# Patient Record
Sex: Male | Born: 1970 | Race: White | Hispanic: No | Marital: Married | Smoking: Current every day smoker
Health system: Southern US, Community
[De-identification: ages and names within clinical notes are randomized; demographics above are authoritative.]

## PROBLEM LIST (undated history)

## (undated) DIAGNOSIS — E785 Hyperlipidemia, unspecified: Secondary | ICD-10-CM

## (undated) DIAGNOSIS — I2699 Other pulmonary embolism without acute cor pulmonale: Secondary | ICD-10-CM

## (undated) DIAGNOSIS — M25561 Pain in right knee: Secondary | ICD-10-CM

## (undated) DIAGNOSIS — E119 Type 2 diabetes mellitus without complications: Secondary | ICD-10-CM

## (undated) DIAGNOSIS — I10 Essential (primary) hypertension: Secondary | ICD-10-CM

## (undated) DIAGNOSIS — F909 Attention-deficit hyperactivity disorder, unspecified type: Secondary | ICD-10-CM

## (undated) DIAGNOSIS — I82409 Acute embolism and thrombosis of unspecified deep veins of unspecified lower extremity: Secondary | ICD-10-CM

## (undated) DIAGNOSIS — I2510 Atherosclerotic heart disease of native coronary artery without angina pectoris: Secondary | ICD-10-CM

## (undated) DIAGNOSIS — I252 Old myocardial infarction: Secondary | ICD-10-CM

## (undated) DIAGNOSIS — K429 Umbilical hernia without obstruction or gangrene: Secondary | ICD-10-CM

## (undated) DIAGNOSIS — M1990 Unspecified osteoarthritis, unspecified site: Secondary | ICD-10-CM

## (undated) HISTORY — DX: Pain in right knee: M25.561

## (undated) HISTORY — DX: Essential (primary) hypertension: I10

## (undated) HISTORY — DX: Hyperlipidemia, unspecified: E78.5

## (undated) HISTORY — DX: Old myocardial infarction: I25.2

## (undated) HISTORY — DX: Atherosclerotic heart disease of native coronary artery without angina pectoris: I25.10

## (undated) HISTORY — DX: Acute embolism and thrombosis of unspecified deep veins of unspecified lower extremity: I82.409

## (undated) HISTORY — PX: COLONOSCOPY: WVUENDOPRO10

## (undated) HISTORY — DX: Other pulmonary embolism without acute cor pulmonale: I26.99

## (undated) HISTORY — DX: Type 2 diabetes mellitus without complications: E11.9

## (undated) HISTORY — DX: Attention-deficit hyperactivity disorder, unspecified type: F90.9

## (undated) HISTORY — PX: HX LAP CHOLECYSTECTOMY: SHX56

## (undated) HISTORY — PX: HX CORONARY STENT PLACEMENT: SHX49

## (undated) HISTORY — PX: HX CORONARY ARTERY BYPASS GRAFT: SHX141

## (undated) HISTORY — PX: ANKLE SURGERY: SHX546

## (undated) HISTORY — PX: REPLACEMENT TOTAL KNEE: SUR1224

## (undated) HISTORY — PX: UMBILICAL HERNIA REPAIR: SHX196

## (undated) HISTORY — PX: CHOLECYSTECTOMY: SHX55

## (undated) HISTORY — DX: Unspecified osteoarthritis, unspecified site: M19.90

## (undated) HISTORY — DX: Umbilical hernia without obstruction or gangrene: K42.9

## (undated) HISTORY — PX: VASECTOMY: SHX75

---

## 2004-10-15 HISTORY — PX: KNEE ARTHROSCOPY: SUR90

## 2009-10-27 ENCOUNTER — Encounter (INDEPENDENT_AMBULATORY_CARE_PROVIDER_SITE_OTHER): Payer: Self-pay | Admitting: ORTHOPEDIC, SPORTS MEDICINE

## 2009-10-27 ENCOUNTER — Ambulatory Visit (INDEPENDENT_AMBULATORY_CARE_PROVIDER_SITE_OTHER): Payer: BC Managed Care – PPO

## 2009-10-27 ENCOUNTER — Ambulatory Visit (INDEPENDENT_AMBULATORY_CARE_PROVIDER_SITE_OTHER): Payer: BC Managed Care – PPO | Admitting: ORTHOPEDIC, SPORTS MEDICINE

## 2009-10-27 ENCOUNTER — Encounter (FREE_STANDING_LABORATORY_FACILITY)
Admit: 2009-10-27 | Discharge: 2009-10-27 | Disposition: A | Discharge status: Home / Self Care | Payer: BLUE CROSS/BLUE SHIELD | Attending: Orthopaedics-Adult Reconstruction | Admitting: Orthopaedics-Adult Reconstruction

## 2009-10-27 VITALS — BP 156/101 | HR 98 | Temp 97.8°F | Ht 73.0 in | Wt 290.0 lb

## 2009-10-27 DIAGNOSIS — M25569 Pain in unspecified knee: Secondary | ICD-10-CM | POA: Diagnosis present

## 2009-10-27 LAB — RHEUMATOID FACTOR, SERUM: RHEUMATOID FACTOR: <20 IU/mL (ref <20)

## 2009-10-27 LAB — SEDIMENTATION RATE: SEDIMENTATION RATE: 1 mm/hr (ref 0–15)

## 2009-10-27 MED ORDER — SODIUM HYALURONATE 10 MG/ML INTRA-ARTICULAR SYRINGE
INJECTION | INTRA_ARTICULAR | Status: DC
Start: 2009-10-27 — End: 2023-03-12

## 2009-10-27 MED ORDER — DICLOFENAC SODIUM 75 MG TABLET,DELAYED RELEASE
75.0000 mg | DELAYED_RELEASE_TABLET | Freq: Every day | ORAL | Status: DC
Start: 2009-10-27 — End: 2023-03-12

## 2009-10-27 NOTE — Progress Notes (Signed)
Name: [facility]  MEDICAL RECORD [id number]  DATE OF BIRTH: 07-20-1971  DATE OF SERVICE: 10/27/2009       SUBJECTIVE:  Shawn Zhang is a pleasant 39 y.o. year old who presents to clinic today for evaluation of his knee.  Pain has been present for many years. There has been antecedent trauma. Date of Injury (DOI) = football injury in high school. Has had arthroscopic surgery on his right knee twice. The pain is localized to the anterior/medial aspect of the joint. Rated as 7 out of 10. Worsened with prolonged walking or sitting. Associated symptoms include: swelling. Prior treatments include: ice, naproxen, pt, steroid injections, viscous injections, and brace. These were helpful.       PAST MEDICAL HISTORY  No past medical history on file.     PAST SURGICAL HISTORY  No past surgical history on file.    MEDICATIONS  Current outpatient prescriptions   Medication Sig    Diclofenac Sodium (VOLTAREN) 75 mg TbEC take 1 Tab by mouth Once a day.     Sodium Hyaluronate (Viscosup) (EUFLEXXA) 10 mg/mL Syrg by Intra-articular route. To be injected once a week x 3 weeks by physician          ALLERGIES  No Known Allergies.    SOCIAL HISTORY  History   Social History    Marital Status: Single     Spouse Name: N/A     Number of Children: N/A    Years of Education: N/A   Occupational History    Not on file.   Social History Main Topics    Tobacco Use: Yes -- 0.5 packs/day for 20 years    Alcohol Use: Not on file    Drug Use: Not on file    Sexually Active: Not on file   Other Topics Concern    Not on file   Social History Narrative    No narrative on file         FAMILY HISTORY  DM, CAD    REVIEW OF SYSTEMS  As above, otherwise all other systems are essentially negative.    PHYSICAL EXAM    BP 156/101   Pulse 98   Temp 36.6 C (97.8 F)   Ht 1.854 m (6\' 1" )   Wt 131.543 kg (290 lb)       In no acute distress with appropriate mood and affect.  He is alert and oriented times three.  HEENT: NC/AT Pupils are  equal and round.  PULM: respirations are unlabored.  CV: Pulses palpable and equal distally.  GI: Abdomen is nondistended.  DERM: Skin is warm and dry to touch.    On physical examination of the patient's bilateral knee, .    Range of Motion:   Strength:   Tenderness to Palpation:   Swelling:   Special Test:   Neurologic:   Gait:       RADIOGRAPHY:  Plain film x-rays of the patient's right and bilateral knee were reviewed in clinic today and show R>L advanced arthrosis with osteophyte formation, and joint space narrowing throughout.     ASSESSMENT: 39 y.o. year old male with Encounter Diagnoses   Code Name Primary? Qualifier    719.46J Knee pain Yes     Plan: XR KNEE SPORTS SERIES, BILAT, DICLOFENAC 75 MG TAB, DELAYED RELEASE, C-REACTIVE PROTEIN (INFLAMMATION), SEDIMENTATION RATE, ANA, CYCLIC CITRULLINATED PEP IGG, RHEUMATOID FACTOR    715.96Q OA (osteoarthritis) of knee      Plan: XR KNEE SPORTS  SERIES, BILAT, DICLOFENAC 75 MG TAB, DELAYED RELEASE      .      PLAN:  At today's visit, we discussed treatment options concerning patient's bilateral knee arthrosis.  Will check labwork for possible rheumatologic component given his age, but clinically this is most consistent with degenrative osteoarthrosis.  Will try voltaren as he has only used ibuprofen and naproxen in the past.  Also will consider Euflexxa injections and have requested authorization for these today.  He  will f/u in clinic for the euflexxa injections.  If the patient has any questions or concerns prior to next visit, he can contact our office.        Levander Campion, MD 10/27/2009, 2:35 PM

## 2009-10-29 LAB — CYCLIC CITRULLINATED PEPTIDE ANTIBODIES, IGG, SERUM: CYCLIC CITRULLINATED PEP IgG: 4

## 2009-10-31 LAB — HEP-2 SUBSTRATE ANTINUCLEAR ANTIBODIES (ANA), SERUM: ANTI-NUCLEAR AB: NEGATIVE

## 2010-07-11 ENCOUNTER — Ambulatory Visit (HOSPITAL_COMMUNITY): Payer: Self-pay

## 2011-06-04 ENCOUNTER — Encounter (INDEPENDENT_AMBULATORY_CARE_PROVIDER_SITE_OTHER): Payer: Self-pay | Admitting: Surgery

## 2011-06-07 ENCOUNTER — Ambulatory Visit (INDEPENDENT_AMBULATORY_CARE_PROVIDER_SITE_OTHER): Payer: BC Managed Care – PPO | Admitting: Surgery

## 2011-06-15 ENCOUNTER — Ambulatory Visit (INDEPENDENT_AMBULATORY_CARE_PROVIDER_SITE_OTHER): Payer: BC Managed Care – PPO | Admitting: Surgery

## 2011-06-15 ENCOUNTER — Encounter (INDEPENDENT_AMBULATORY_CARE_PROVIDER_SITE_OTHER): Payer: Self-pay | Admitting: Surgery

## 2011-06-15 VITALS — BP 144/98 | HR 80 | Temp 97.3°F | Ht 73.0 in | Wt 304.6 lb

## 2011-06-15 DIAGNOSIS — K429 Umbilical hernia without obstruction or gangrene: Secondary | ICD-10-CM

## 2011-06-15 NOTE — Patient Instructions (Signed)
Try to continue loosing weight.  Try to quit smoking.  No limit on exercise or lifting.

## 2011-06-15 NOTE — Progress Notes (Signed)
ASSESSMENT AND PLAN: 1.  Umbilical hernia.  I gave the patient a book on hernia repairs.  I discussed the indications and complications of hernia surgery with the patient.  I discussed both the laparoscopic and open approach to hernia repair..  The potential risks of hernia surgery include, but are not limited to, bleeding, infection, open surgery, nerve injury, and recurrence of the hernia.  I provided the patient literature about hernia surgery.  I discussed two goals:  1.) quitting smoking, 2.) continuing to loose weight.  Both of these goals will improve the chance of the hernia surgery doing better and better long term results.  I did not limit his activity.  The patient will see me back in 4 months.  He will see me earlier if the hernia enlarges rapidly or causes more symptoms.  2.  Diabetes Mellitus  Diet only.  Blood sugars are better since he has lost weight.  3.  Morbid Obesity - BMI 40  Trying to loose weight to control diabetes.  4.  Smokes 1 PPD. 5.  Degenerative arthritis of right knee.  Chief Complaint  Patient presents with  . Other    new pt- eval umb hernia    Justin Sweeney is a 40 y.o. (DOB: Mar 12, 1971)  white male who is a patient of Mauricio Po, FNP, [facility], and comes to me today for umiblical hernia.  The patient recently moved here to our area from [place]. 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  He has noticed an umbilical hernia for about one year. He developed gallbladder disease and had a cholecystectomy in [place] about 6 months ago. He had the umbilical hernia before the cholecystectomy, but they did not fix the umbilical hernia at the time of surgery because of the risk of infection.  He actually had heard all the pros and cons about the hernia repair in [place].    He has pain from the umbilical hernia. He denies a history of peptic ulcer disease, liver disease, pancreatic disease, or colon  disease.  He is accompanied by his wife.  Past Medical History  Diagnosis Date  . Diabetes mellitus   . Umbilical hernia   . Arthritis   . Umbilical hernia     Past Surgical History  Procedure Date  . Cholecystectomy     2011  . Vasectomy     2011  . Knee arthroscopy 2006    right x2    Current Outpatient Prescriptions  Medication Sig Dispense Refill  . atomoxetine (STRATTERA) 40 MG capsule Take 40 mg by mouth daily.        . Lancets (ONETOUCH ULTRASOFT) lancets daily.      . ONE TOUCH ULTRA TEST test strip daily.       No Known Allergies  Review of Systems: Skin:  No history of rash.  No history of abnormal moles. Infection:    No history of MRSA. Neurologic:  No history of stroke.  No history of seizure.  No history of headaches. Cardiac:  No history of hypertension. No history of heart disease.  No history of prior cardiac catheterization.  No history of seeing a cardiologist. Pulmonary:  Does  smoke cigarettes. 1 PPD.  No asthma or bronchitis.  No OSA/CPAP.  Endocrine:  Diabetes, but diet controlled.  And with his weight loss, his blood sugars are better. No thyroid disease. Gastrointestinal:  No history of stomach disease.  No history of liver disease.  No history of gall bladder disease.  No history of pancreas disease.  No history of colon disease. Urologic:  No history of kidney stones.  No history of bladder infections. Musculoskeletal:  Right knee arthroscopy in past.  The knee still bothers him and limits his activity.  He has seen [facility] secondary to a fall. Hematologic:  No bleeding disorder.  No history of anemia.  Not anticoagulated.  SOCIAL and FAMILY HISTORY: Accopanied with wife. Works at [employer] a [employer] ([employer] - [place]). Two children - 14 and 11  PHYSICAL EXAM: BP 144/98  Pulse 80  Temp(Src) 97.3 F (36.3 C) (Temporal)  Ht 6\' 1"  (1.854 m)  Wt 304 lb 9.6 oz (138.166 kg)  BMI 40.19 kg/m2  HEENT: Normal.  Pupils equal. Normal dentition. Neck: Supple. No thyroid mass. Lymph Nodes:  No supraclavicular or cervical nodes. Lungs: Clear and symmetric. Heart:  RRR. No murmur.  Abdomen: Obese. No mass. No tenderness. Umbilical hernia.  Reducible.  Normal bowel sounds.  No abdominal scars. Rectal: Not done. Extremities:  Good strength in upper and lower extremities. Neurologic:  Grossly intact to motor and sensory function.  DATA REVIEWED: Chart from [facility]

## 2011-11-09 ENCOUNTER — Encounter (INDEPENDENT_AMBULATORY_CARE_PROVIDER_SITE_OTHER): Payer: Self-pay | Admitting: Surgery

## 2011-11-09 ENCOUNTER — Ambulatory Visit (INDEPENDENT_AMBULATORY_CARE_PROVIDER_SITE_OTHER): Payer: BC Managed Care – PPO | Admitting: Surgery

## 2011-11-09 VITALS — BP 158/90 | HR 72 | Temp 97.4°F | Resp 18 | Ht 73.0 in | Wt 286.0 lb

## 2011-11-09 DIAGNOSIS — K429 Umbilical hernia without obstruction or gangrene: Secondary | ICD-10-CM | POA: Insufficient documentation

## 2011-11-09 NOTE — Progress Notes (Signed)
ASSESSMENT AND PLAN: 1.  Umbilical hernia.  I had reviewed with the patient and I gave him a book on hernia repairs in June 15, 2011.  I discussed the indications and complications of hernia surgery with the patient.  I discussed both the laparoscopic and open approach to hernia repair..  The potential risks of hernia surgery include, but are not limited to, bleeding, infection, open surgery, nerve injury, and recurrence of the hernia.  I provided the patient literature about hernia surgery at his last visit and he said he did not need more.  He had two goals:  1.) quitting smoking (he has cut back, to below 1 ppd), 2.) continuing to loose weight (he has lost an additional 20 pounds).   He's ready to go ahead with surgery.  We will schedule this. 2.  Diabetes Mellitus  Diet only.  Blood sugars are better since he has lost weight. 3.  Morbid Obesity - BMI 37.7  He has lost almost 40 pounds since he has been trying.  I tried to encourage him to continue. 4.  Smokes 1 PPD.  He says his is under one PPD.  He tried Chantix, but this gave him severe migraine HAs. 5.  Degenerative arthritis of right knee. 6.  ADD - he was on Strattera, but has been switched to Adderal with which he is doing better.  Chief Complaint  Patient presents with  . Umbilical Hernia   Justin Sweeney is a 41 y.o. (DOB: [birth year])  white male who is a patient of Mauricio Po, FNP, [facility], and comes to me today for follow up of an umiblical hernia. I originally saw him in August 2012.  He is having more epigastric pain since I last saw him, but I am not sure this is coming from the umbilical hernia.  He has lost weight and his is cutting back on his smoking.   He denies a history of peptic ulcer disease, liver disease, pancreatic disease, or colon disease. He is ready to go ahead with the hernia repair.  Past Medical History  Diagnosis Date  . Diabetes mellitus   . Umbilical hernia   . Arthritis   .  Umbilical hernia     Past Surgical History  Procedure Date  . Cholecystectomy     2011  . Vasectomy     2011  . Knee arthroscopy 2006    right x2    Current Outpatient Prescriptions  Medication Sig Dispense Refill  . amphetamine-dextroamphetamine (ADDERALL XR) 20 MG 24 hr capsule Take 20 mg by mouth 2 (two) times daily.      . Lancets (ONETOUCH ULTRASOFT) lancets daily.      . ONE TOUCH ULTRA TEST test strip daily.       No Known Allergies  Review of Systems: Skin:  No history of rash.  No history of abnormal moles. Infection:    No history of MRSA. Neurologic:  No history of stroke.  No history of seizure.  No history of headaches. Cardiac:  No history of hypertension. No history of heart disease.  No history of prior cardiac catheterization.  No history of seeing a cardiologist. Pulmonary:  Does  smoke cigarettes. 1 PPD.  No asthma or bronchitis.  No OSA/CPAP.  Endocrine:  Diabetes, but diet controlled.  And with his weight loss, his blood sugars are better. No thyroid disease. Gastrointestinal:  No history of stomach disease.  No history of liver disease.  No history of gall bladder  disease.  No history of pancreas disease.  No history of colon disease. Urologic:  No history of kidney stones.  No history of bladder infections. Musculoskeletal:  Right knee arthroscopy in past.  The knee still bothers him and limits his activity.  He has seen [facility] secondary to a fall. Hematologic:  No bleeding disorder.  No history of anemia.  Not anticoagulated. Psychologic:  History of ADD.  On Adderal now and doing better.  SOCIAL and FAMILY HISTORY: Accopanied with wife. Works at [employer] a [employer] ([employer] - [place]). Two children - 14 and 11  PHYSICAL EXAM: BP 158/90  Pulse 72  Temp(Src) 97.4 F (36.3 C) (Temporal)  Resp 18  Ht 6\' 1"  (1.854 m)  Wt 286 lb (129.729 kg)  BMI 37.73 kg/m2  HEENT: Normal. Pupils equal. Normal dentition. Neck: Supple.  No thyroid mass. Lymph Nodes:  No supraclavicular or cervical nodes. Lungs: Clear and symmetric. Heart:  RRR. No murmur.  Abdomen: Obese. No mass. No tenderness. Umbilical hernia.  Reducible.  Normal bowel sounds.  No abdominal scars.  He points to pain between his umbilicus and xiphoid, but I feel no mass in that area. Rectal: Not done. Extremities:  Good strength in upper and lower extremities. Neurologic:  Grossly intact to motor and sensory function.  DATA REVIEWED: None new.

## 2011-11-13 DIAGNOSIS — K429 Umbilical hernia without obstruction or gangrene: Secondary | ICD-10-CM

## 2011-11-13 HISTORY — PX: HERNIA REPAIR: SHX51

## 2011-11-14 ENCOUNTER — Telehealth (INDEPENDENT_AMBULATORY_CARE_PROVIDER_SITE_OTHER): Payer: Self-pay | Admitting: Surgery

## 2011-11-14 ENCOUNTER — Encounter (INDEPENDENT_AMBULATORY_CARE_PROVIDER_SITE_OTHER): Payer: Self-pay

## 2011-11-14 NOTE — Telephone Encounter (Signed)
I called the patient and gave his appointment.  I typed a letter for his wife to pick up tomorrow.

## 2011-12-07 ENCOUNTER — Ambulatory Visit (INDEPENDENT_AMBULATORY_CARE_PROVIDER_SITE_OTHER): Payer: BC Managed Care – PPO | Admitting: Surgery

## 2011-12-07 ENCOUNTER — Encounter (INDEPENDENT_AMBULATORY_CARE_PROVIDER_SITE_OTHER): Payer: Self-pay | Admitting: Surgery

## 2011-12-07 VITALS — BP 114/86 | HR 72 | Temp 98.4°F | Resp 16 | Ht 73.0 in | Wt 285.4 lb

## 2011-12-07 DIAGNOSIS — K429 Umbilical hernia without obstruction or gangrene: Secondary | ICD-10-CM

## 2011-12-07 NOTE — Progress Notes (Signed)
ASSESSMENT AND PLAN: 1.  Umbilical hernia.  Umbilical hernia repair 11/13/2011 at [facility].  Repaired with 6.4 cm circular Proceed.  He had two goals:  1.) quitting smoking (he has cut back, to below 1 ppd), 2.) continuing to loose weight (he lost an additional 20 pounds before surgery).    2.  Diabetes Mellitus  Diet only.  Blood sugars are better since he has lost weight. 3.  Morbid Obesity - BMI 37.7  He has lost almost 40 pounds since he has been trying.  I tried to encourage him to continue. 4.  Smokes 1 PPD.  He says his is under one PPD.  He tried Chantix, but this gave him severe migraine HAs. 5.  Degenerative arthritis of right knee. 6.  ADD - he was on Strattera, but has been switched to Adderal with which he is doing better.  Chief Complaint  Patient presents with  . Routine Post Op    PO umb hernia 11/13/11   Justin Sweeney is a 41 y.o. (DOB: July 09, 1971)  white male who is a patient of Mauricio Po, FNP, [facility], and comes to me today for follow up of an umiblical hernia repair on 11/13/2011. He has done well.  He has no pain.  And he is back at work.  I told him to limit his lifting for a full month after the surgery.  Past Medical History  Diagnosis Date  . Diabetes mellitus   . Umbilical hernia   . Arthritis   . Umbilical hernia    Current Outpatient Prescriptions  Medication Sig Dispense Refill  . amphetamine-dextroamphetamine (ADDERALL XR) 20 MG 24 hr capsule Take 20 mg by mouth 2 (two) times daily.      . Lancets (ONETOUCH ULTRASOFT) lancets daily.      Marland Kitchen lisinopril (PRINIVIL,ZESTRIL) 10 MG tablet daily.      . ONE TOUCH ULTRA TEST test strip daily.       No Known Allergies  Review of Systems: Pulmonary:  Does  smoke cigarettes. 1 PPD.  No asthma or bronchitis.  No OSA/CPAP. Endocrine:  Diabetes, but diet controlled.  And with his weight loss, his blood sugars are better. No thyroid disease.  Musculoskeletal:  Right knee  arthroscopy in past.  The knee still bothers him and limits his activity.  He has seen [facility] secondary to a fall. Psychologic:  History of ADD.  On Adderal now and doing better.  SOCIAL and FAMILY HISTORY: Married. Works at [employer] a [employer] ([employer] - [place]). Two children - 14 and 11  PHYSICAL EXAM: BP 114/86  Pulse 72  Temp(Src) 98.4 F (36.9 C) (Temporal)  Resp 16  Ht 6\' 1"  (1.854 m)  Wt 285 lb 6.4 oz (129.457 kg)  BMI 37.65 kg/m2  Abdomen: Scar from umbilical hernia repair looks good.  DATA REVIEWED: None new.

## 2011-12-20 ENCOUNTER — Encounter (INDEPENDENT_AMBULATORY_CARE_PROVIDER_SITE_OTHER): Payer: Self-pay | Admitting: Surgery

## 2013-12-15 ENCOUNTER — Encounter (HOSPITAL_BASED_OUTPATIENT_CLINIC_OR_DEPARTMENT_OTHER): Payer: Self-pay | Admitting: Emergency Medicine

## 2013-12-15 ENCOUNTER — Emergency Department (HOSPITAL_BASED_OUTPATIENT_CLINIC_OR_DEPARTMENT_OTHER): Payer: Worker's Compensation

## 2013-12-15 ENCOUNTER — Emergency Department (HOSPITAL_BASED_OUTPATIENT_CLINIC_OR_DEPARTMENT_OTHER)
Admission: EM | Admit: 2013-12-15 | Discharge: 2013-12-15 | Disposition: A | Discharge status: Home / Self Care | Payer: Worker's Compensation | Attending: Emergency Medicine | Admitting: Emergency Medicine

## 2013-12-15 DIAGNOSIS — M25522 Pain in left elbow: Secondary | ICD-10-CM

## 2013-12-15 DIAGNOSIS — S59919A Unspecified injury of unspecified forearm, initial encounter: Secondary | ICD-10-CM

## 2013-12-15 DIAGNOSIS — E119 Type 2 diabetes mellitus without complications: Secondary | ICD-10-CM | POA: Insufficient documentation

## 2013-12-15 DIAGNOSIS — M25512 Pain in left shoulder: Secondary | ICD-10-CM

## 2013-12-15 DIAGNOSIS — Y9289 Other specified places as the place of occurrence of the external cause: Secondary | ICD-10-CM | POA: Insufficient documentation

## 2013-12-15 DIAGNOSIS — S59909A Unspecified injury of unspecified elbow, initial encounter: Secondary | ICD-10-CM | POA: Insufficient documentation

## 2013-12-15 DIAGNOSIS — Y990 Civilian activity done for income or pay: Secondary | ICD-10-CM | POA: Insufficient documentation

## 2013-12-15 DIAGNOSIS — Y9389 Activity, other specified: Secondary | ICD-10-CM | POA: Insufficient documentation

## 2013-12-15 DIAGNOSIS — S46909A Unspecified injury of unspecified muscle, fascia and tendon at shoulder and upper arm level, unspecified arm, initial encounter: Secondary | ICD-10-CM | POA: Insufficient documentation

## 2013-12-15 DIAGNOSIS — F17200 Nicotine dependence, unspecified, uncomplicated: Secondary | ICD-10-CM | POA: Insufficient documentation

## 2013-12-15 DIAGNOSIS — Z8739 Personal history of other diseases of the musculoskeletal system and connective tissue: Secondary | ICD-10-CM | POA: Insufficient documentation

## 2013-12-15 DIAGNOSIS — S4980XA Other specified injuries of shoulder and upper arm, unspecified arm, initial encounter: Secondary | ICD-10-CM | POA: Insufficient documentation

## 2013-12-15 DIAGNOSIS — S6990XA Unspecified injury of unspecified wrist, hand and finger(s), initial encounter: Secondary | ICD-10-CM | POA: Insufficient documentation

## 2013-12-15 DIAGNOSIS — Z8719 Personal history of other diseases of the digestive system: Secondary | ICD-10-CM | POA: Insufficient documentation

## 2013-12-15 DIAGNOSIS — X500XXA Overexertion from strenuous movement or load, initial encounter: Secondary | ICD-10-CM | POA: Insufficient documentation

## 2013-12-15 MED ORDER — HYDROCODONE-ACETAMINOPHEN 5-325 MG PO TABS: 2.0000 | ORAL_TABLET | ORAL | Status: AC | PRN

## 2013-12-15 MED ORDER — HYDROCODONE-ACETAMINOPHEN 5-325 MG PO TABS
2.0000 | ORAL_TABLET | Freq: Once | ORAL | Status: AC
  Administered 2013-12-15: 2 via ORAL
  Filled 2013-12-15: qty 2

## 2013-12-15 NOTE — ED Provider Notes (Signed)
CSN: [id number]     Arrival date & time 12/15/13  1825 History   First MD Initiated Contact with Patient 12/15/13 1857     Chief Complaint  Patient presents with  . Arm Injury     (Consider location/radiation/quality/duration/timing/severity/associated sxs/prior Treatment) Patient is a 43 y.o. male presenting with arm injury.  Arm Injury Location:  Shoulder and elbow Time since incident:  1 day Injury: no   Shoulder location:  L shoulder Elbow location:  L elbow Pain details:    Quality:  Aching   Radiates to:  Does not radiate   Severity:  Moderate   Onset quality:  Gradual   Timing:  Constant   Progression:  Worsening Chronicity:  New Dislocation: no   Worsened by:  Nothing tried Associated symptoms: no back pain    Pt was lifting today and had a pop in his elbow and his shoulder  Past Medical History  Diagnosis Date  . Diabetes mellitus   . Umbilical hernia   . Arthritis   . Umbilical hernia    Past Surgical History  Procedure Laterality Date  . Cholecystectomy      2011  . Vasectomy      2011  . Knee arthroscopy  2006    right x2  . Hernia repair  11/13/11    umb hernia   Family History  Problem Relation Age of Onset  . Diabetes Mother    History  Substance Use Topics  . Smoking status: Current Every Day Smoker -- 1.00 packs/day    Types: Cigarettes  . Smokeless tobacco: Not on file  . Alcohol Use: No    Review of Systems  Musculoskeletal: Negative for back pain.  All other systems reviewed and are negative.      Allergies  Review of patient's allergies indicates no known allergies.  Home Medications   Current Outpatient Rx  Name  Route  Sig  Dispense  Refill  . amphetamine-dextroamphetamine (ADDERALL XR) 20 MG 24 hr capsule   Oral   Take 20 mg by mouth 2 (two) times daily.         . Lancets (ONETOUCH ULTRASOFT) lancets      daily.         Marland Kitchen. lisinopril (PRINIVIL,ZESTRIL) 10 MG tablet      daily.         . ONE TOUCH ULTRA  TEST test strip      daily.          BP 137/90  Temp(Src) 98.7 F (37.1 C) (Oral)  Resp 20  Ht 6\' 1"  (1.854 m)  Wt 275 lb (124.739 kg)  BMI 36.29 kg/m2  SpO2 100% Physical Exam  Nursing note and vitals reviewed. Constitutional: He is oriented to person, place, and time. He appears well-developed and well-nourished.  Musculoskeletal: He exhibits tenderness.  Tender left elbow and left shoulder, no swelling,  Decreased range of motion,  nv and ns intact  Neurological: He is alert and oriented to person, place, and time. He has normal reflexes.  Skin: Skin is warm.  Psychiatric: He has a normal mood and affect.    ED Course  Procedures (including critical care time) Labs Review Labs Reviewed - No data to display Imaging Review Dg Elbow Complete Left  12/15/2013   CLINICAL DATA:  Pulling injury.  Pain.  EXAM: LEFT ELBOW - COMPLETE 3+ VIEW  COMPARISON:  None.  FINDINGS: No fracture or dislocation.  IMPRESSION: No fracture or dislocation.   Electronically Signed  By: Bridgett Larsson M.D.   On: 12/15/2013 19:41   Dg Shoulder Left  12/15/2013   CLINICAL DATA:  Pulling injury and felt pop.  Pain.  EXAM: LEFT SHOULDER - 2+ VIEW  COMPARISON:  None.  FINDINGS: No fracture or dislocation.  Mild pulmonary vascular prominence.  IMPRESSION: No fracture or dislocation.   Electronically Signed   By: Bridgett Larsson M.D.   On: 12/15/2013 19:40     EKG Interpretation None      MDM   Final diagnoses:  Left shoulder pain  Left elbow pain    xrays normal.   Pt given sling for comfort,  Ice,  Hydrocodone.   See Dr. Pearletha Forge for recheck in 3-4 days    Elson Areas, PA-C 12/15/13 2014

## 2013-12-15 NOTE — Discharge Instructions (Signed)
   Shoulder Pain The shoulder is the joint that connects your arms to your body. The bones that form the shoulder joint include the upper arm bone (humerus), the shoulder blade (scapula), and the collarbone (clavicle). The top of the humerus is shaped like a ball and fits into a rather flat socket on the scapula (glenoid cavity). A combination of muscles and strong, fibrous tissues that connect muscles to bones (tendons) support your shoulder joint and hold the ball in the socket. Small, fluid-filled sacs (bursae) are located in different areas of the joint. They act as cushions between the bones and the overlying soft tissues and help reduce friction between the gliding tendons and the bone as you move your arm. Your shoulder joint allows a wide range of motion in your arm. This range of motion allows you to do things like scratch your back or throw a ball. However, this range of motion also makes your shoulder more prone to pain from overuse and injury. Causes of shoulder pain can originate from both injury and overuse and usually can be grouped in the following four categories:  Redness, swelling, and pain (inflammation) of the tendon (tendinitis) or the bursae (bursitis).  Instability, such as a dislocation of the joint.  Inflammation of the joint (arthritis).  Broken bone (fracture). HOME CARE INSTRUCTIONS   Apply ice to the sore area.  Put ice in a plastic bag.  Place a towel between your skin and the bag.  Leave the ice on for 15-20 minutes, 03-04 times per day for the first 2 days.  Stop using cold packs if they do not help with the pain.  If you have a shoulder sling or immobilizer, wear it as long as your caregiver instructs. Only remove it to shower or bathe. Move your arm as little as possible, but keep your hand moving to prevent swelling.  Squeeze a soft ball or foam pad as much as possible to help prevent swelling.  Only take over-the-counter or prescription medicines for  pain, discomfort, or fever as directed by your caregiver. SEEK MEDICAL CARE IF:   Your shoulder pain increases, or new pain develops in your arm, hand, or fingers.  Your hand or fingers become cold and numb.  Your pain is not relieved with medicines. SEEK IMMEDIATE MEDICAL CARE IF:   Your arm, hand, or fingers are numb or tingling.  Your arm, hand, or fingers are significantly swollen or turn white or blue. MAKE SURE YOU:   Understand these instructions.  Will watch your condition.  Will get help right away if you are not doing well or get worse. Document Released: 07/11/2005 Document Revised: 06/25/2012 Document Reviewed: 09/15/2011 [employer] Patient Information 2014 [employer].  

## 2013-12-15 NOTE — ED Provider Notes (Signed)
Medical screening examination/treatment/procedure(s) were performed by non-physician practitioner and as supervising physician I was immediately available for consultation/collaboration.   EKG Interpretation None        Rolan BuccoMelanie Ariah Mower, MD 12/15/13 2334

## 2013-12-15 NOTE — ED Notes (Signed)
Pt was pulling something heavy at work from a truck and felt a pop in his shoulder and elbow.  Pt complains of unable to lift left arm.

## 2013-12-18 ENCOUNTER — Ambulatory Visit: Payer: Worker's Compensation | Admitting: Family Medicine

## 2016-03-02 IMAGING — CR DG ELBOW COMPLETE 3+V*L*
4 series · 4 of 4 positions shown · non-contrast
Comparison: None.

CLINICAL DATA: Pulling injury.  Pain.

EXAM:
LEFT ELBOW - COMPLETE 3+ VIEW

[x elbow joint ap left]
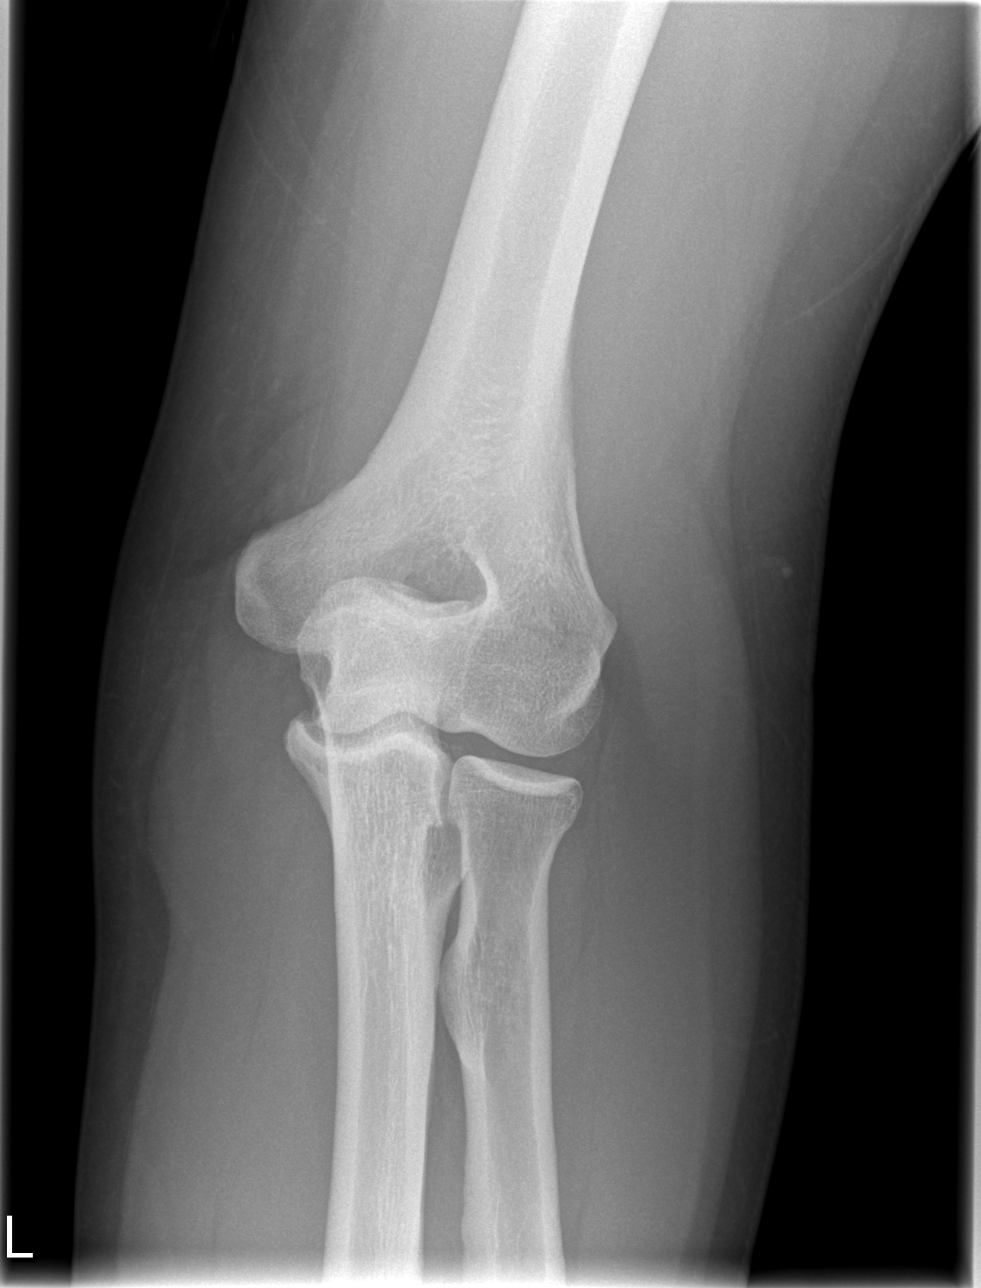

[x elbow joint obl. left (1 of 2)]
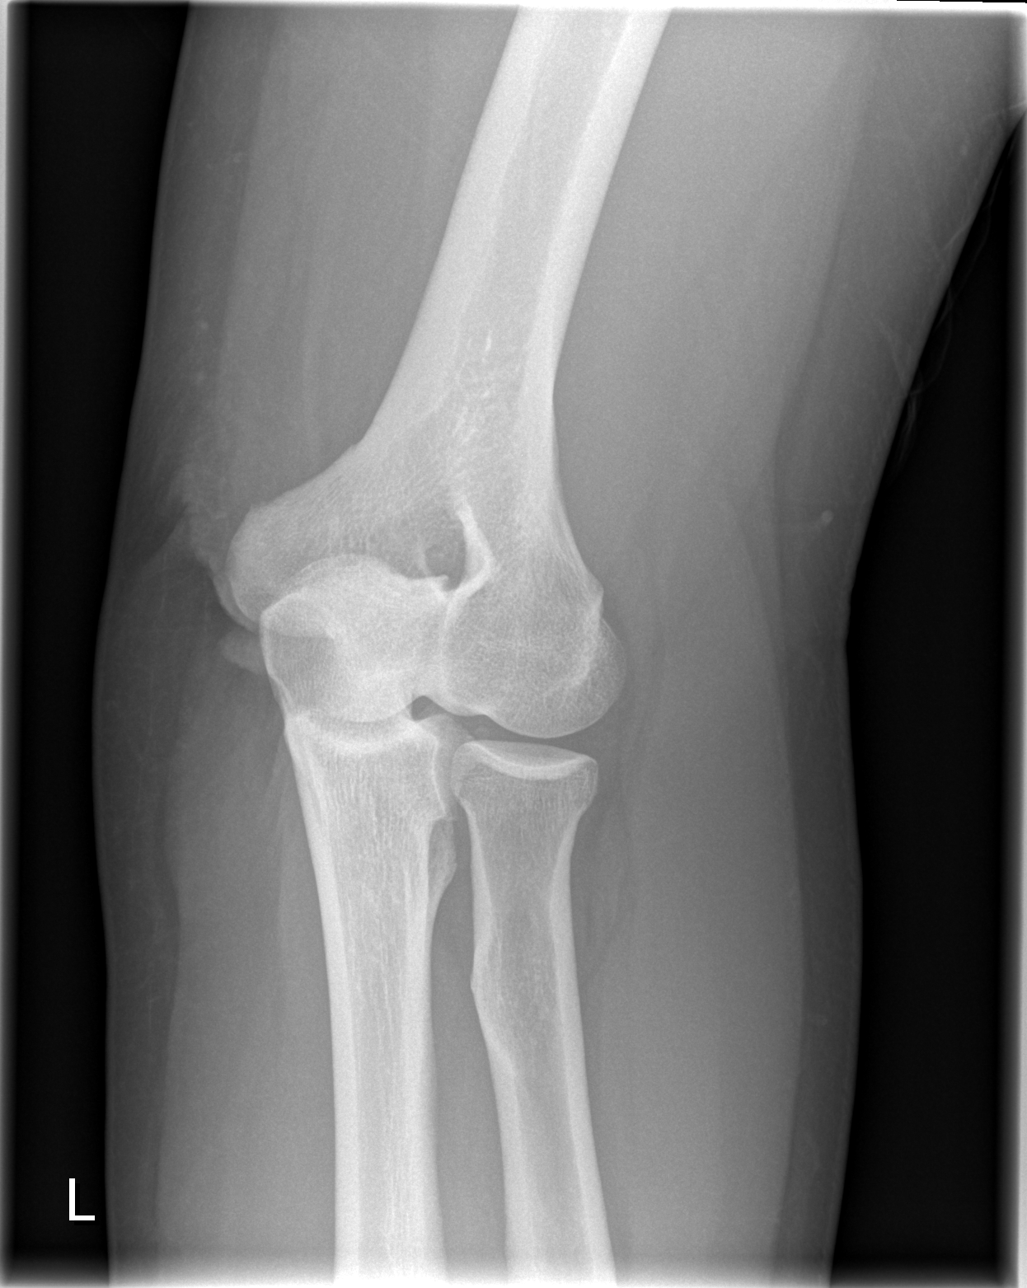

[x elbow joint obl. left (2 of 2)]
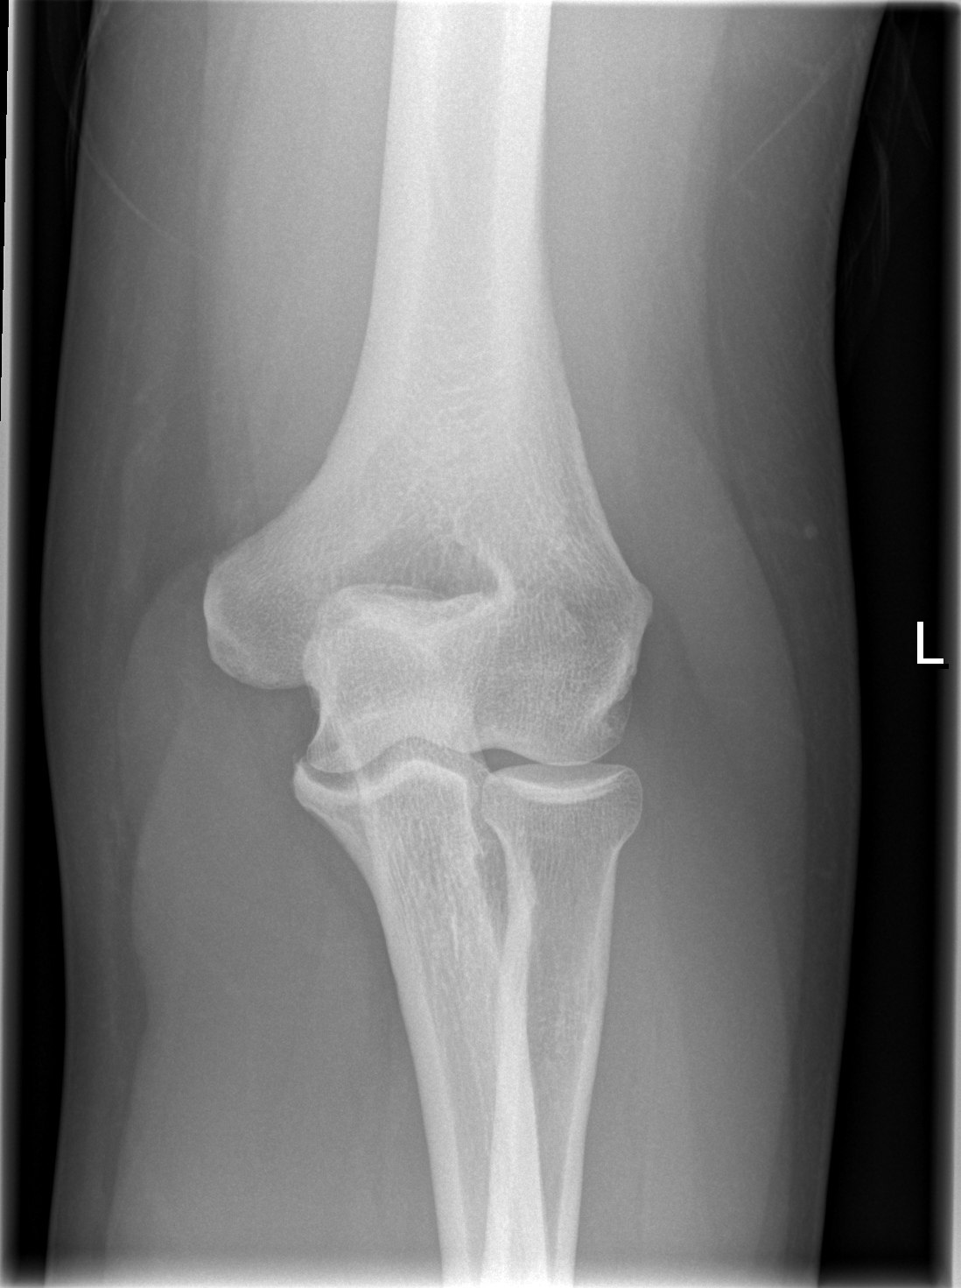

[x elbow joint lat left]
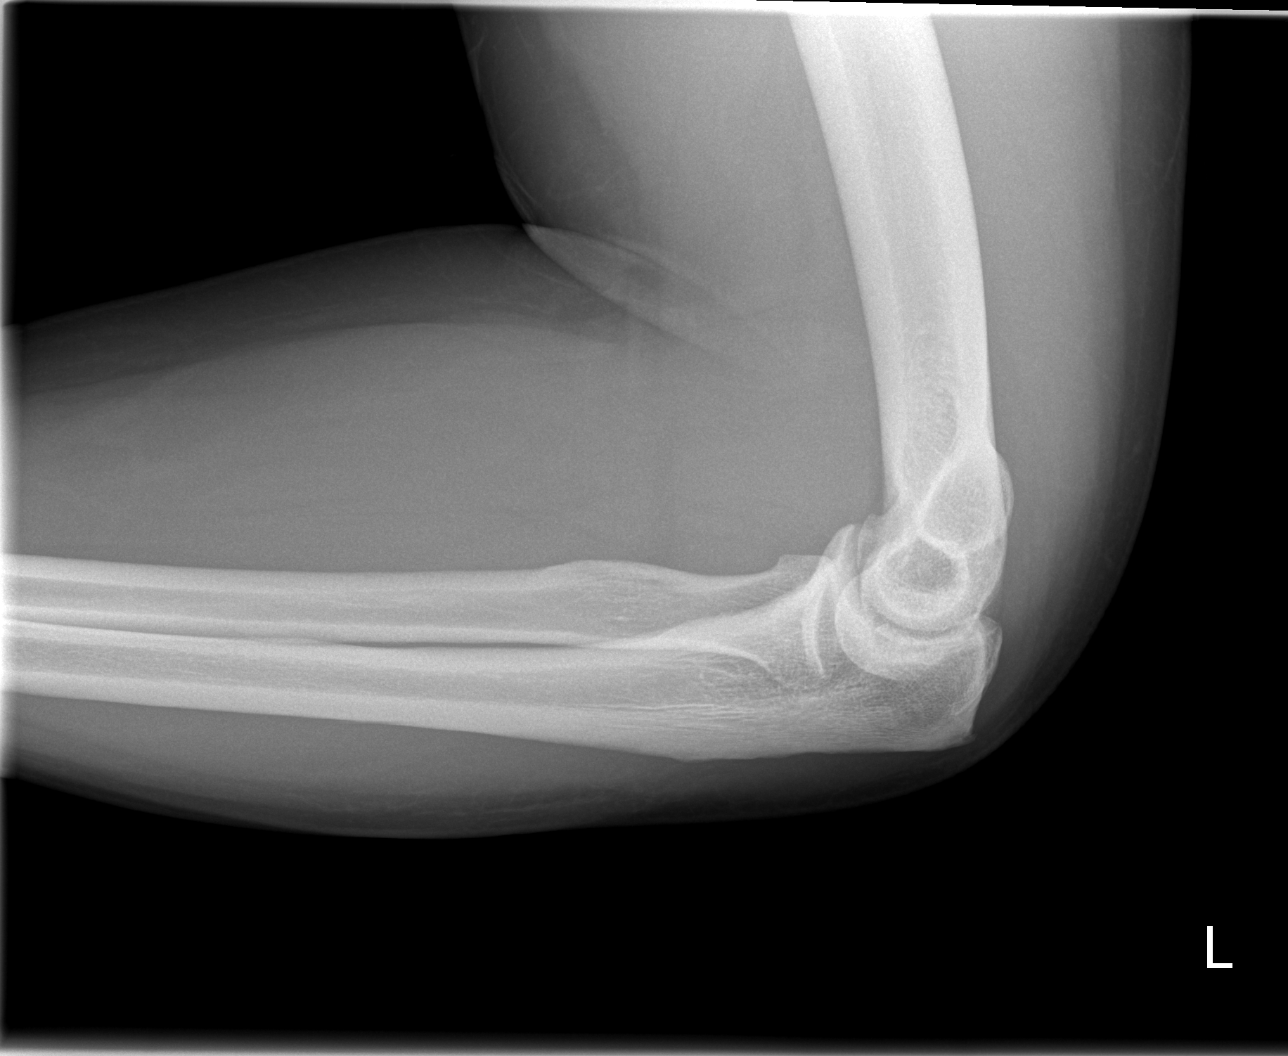

[4 of 4 positions shown; findings below may reference images not displayed]

FINDINGS: No fracture or dislocation.
IMPRESSION: No fracture or dislocation.

## 2018-03-06 DIAGNOSIS — I10 Essential (primary) hypertension: Secondary | ICD-10-CM

## 2018-03-06 DIAGNOSIS — S82891D Other fracture of right lower leg, subsequent encounter for closed fracture with routine healing: Secondary | ICD-10-CM

## 2018-03-06 DIAGNOSIS — Z7982 Long term (current) use of aspirin: Secondary | ICD-10-CM

## 2018-03-06 DIAGNOSIS — Z7901 Long term (current) use of anticoagulants: Secondary | ICD-10-CM

## 2018-03-06 DIAGNOSIS — Z86718 Personal history of other venous thrombosis and embolism: Secondary | ICD-10-CM

## 2018-03-06 DIAGNOSIS — E559 Vitamin D deficiency, unspecified: Secondary | ICD-10-CM

## 2018-03-06 DIAGNOSIS — I2510 Atherosclerotic heart disease of native coronary artery without angina pectoris: Secondary | ICD-10-CM

## 2018-03-06 DIAGNOSIS — Z720 Tobacco use: Secondary | ICD-10-CM

## 2018-05-27 DIAGNOSIS — Z720 Tobacco use: Secondary | ICD-10-CM

## 2018-05-27 DIAGNOSIS — I10 Essential (primary) hypertension: Secondary | ICD-10-CM

## 2018-05-27 DIAGNOSIS — F909 Attention-deficit hyperactivity disorder, unspecified type: Secondary | ICD-10-CM

## 2018-05-27 DIAGNOSIS — I82409 Acute embolism and thrombosis of unspecified deep veins of unspecified lower extremity: Secondary | ICD-10-CM

## 2018-05-27 DIAGNOSIS — Z6841 Body Mass Index (BMI) 40.0 and over, adult: Secondary | ICD-10-CM

## 2018-05-27 DIAGNOSIS — Z7982 Long term (current) use of aspirin: Secondary | ICD-10-CM

## 2018-08-18 DIAGNOSIS — Z86718 Personal history of other venous thrombosis and embolism: Secondary | ICD-10-CM

## 2018-08-18 DIAGNOSIS — M23200 Derangement of unspecified lateral meniscus due to old tear or injury, right knee: Secondary | ICD-10-CM

## 2018-08-18 DIAGNOSIS — M1711 Unilateral primary osteoarthritis, right knee: Secondary | ICD-10-CM

## 2018-08-18 DIAGNOSIS — I2510 Atherosclerotic heart disease of native coronary artery without angina pectoris: Secondary | ICD-10-CM

## 2018-08-18 DIAGNOSIS — M23203 Derangement of unspecified medial meniscus due to old tear or injury, right knee: Secondary | ICD-10-CM

## 2018-08-18 DIAGNOSIS — E119 Type 2 diabetes mellitus without complications: Secondary | ICD-10-CM

## 2018-08-18 DIAGNOSIS — F17290 Nicotine dependence, other tobacco product, uncomplicated: Secondary | ICD-10-CM

## 2018-08-18 DIAGNOSIS — Z7982 Long term (current) use of aspirin: Secondary | ICD-10-CM

## 2018-08-18 DIAGNOSIS — Z6841 Body Mass Index (BMI) 40.0 and over, adult: Secondary | ICD-10-CM

## 2018-08-18 DIAGNOSIS — I10 Essential (primary) hypertension: Secondary | ICD-10-CM

## 2018-09-26 DIAGNOSIS — Z7984 Long term (current) use of oral hypoglycemic drugs: Secondary | ICD-10-CM

## 2018-09-26 DIAGNOSIS — I82401 Acute embolism and thrombosis of unspecified deep veins of right lower extremity: Secondary | ICD-10-CM

## 2018-09-26 DIAGNOSIS — Z7982 Long term (current) use of aspirin: Secondary | ICD-10-CM

## 2018-09-26 DIAGNOSIS — Z87891 Personal history of nicotine dependence: Secondary | ICD-10-CM

## 2018-09-26 DIAGNOSIS — Z6841 Body Mass Index (BMI) 40.0 and over, adult: Secondary | ICD-10-CM

## 2018-09-26 DIAGNOSIS — F17290 Nicotine dependence, other tobacco product, uncomplicated: Secondary | ICD-10-CM

## 2018-09-26 DIAGNOSIS — Z7901 Long term (current) use of anticoagulants: Secondary | ICD-10-CM

## 2018-09-26 DIAGNOSIS — M1711 Unilateral primary osteoarthritis, right knee: Secondary | ICD-10-CM

## 2018-09-26 DIAGNOSIS — Z9861 Coronary angioplasty status: Secondary | ICD-10-CM

## 2018-09-26 DIAGNOSIS — I2699 Other pulmonary embolism without acute cor pulmonale: Secondary | ICD-10-CM

## 2018-09-26 DIAGNOSIS — I2510 Atherosclerotic heart disease of native coronary artery without angina pectoris: Secondary | ICD-10-CM

## 2018-09-26 DIAGNOSIS — E119 Type 2 diabetes mellitus without complications: Secondary | ICD-10-CM

## 2018-09-26 DIAGNOSIS — R600 Localized edema: Secondary | ICD-10-CM

## 2018-11-27 ENCOUNTER — Other Ambulatory Visit (HOSPITAL_BASED_OUTPATIENT_CLINIC_OR_DEPARTMENT_OTHER): Payer: Self-pay | Admitting: Student in an Organized Health Care Education/Training Program

## 2020-10-28 HISTORY — PX: COLONOSCOPY: WVUENDOPRO10

## 2022-12-05 ENCOUNTER — Ambulatory Visit (HOSPITAL_BASED_OUTPATIENT_CLINIC_OR_DEPARTMENT_OTHER): Payer: Self-pay | Admitting: NURSE PRACTITIONER

## 2023-03-07 ENCOUNTER — Other Ambulatory Visit (HOSPITAL_BASED_OUTPATIENT_CLINIC_OR_DEPARTMENT_OTHER): Payer: Self-pay | Admitting: NURSE PRACTITIONER

## 2023-03-07 MED ORDER — DEXTROAMPHETAMINE-AMPHETAMINE 30 MG TABLET
30.00 mg | ORAL_TABLET | Freq: Two times a day (BID) | ORAL | 0 refills | Status: DC
Start: 2023-03-07 — End: 2023-03-25

## 2023-03-08 ENCOUNTER — Encounter (HOSPITAL_BASED_OUTPATIENT_CLINIC_OR_DEPARTMENT_OTHER): Payer: Self-pay | Admitting: INTERNAL MEDICINE-ENDOCRINOLOGY-DIABETES AND METABOLISM

## 2023-03-08 DIAGNOSIS — M25561 Pain in right knee: Secondary | ICD-10-CM | POA: Insufficient documentation

## 2023-03-08 DIAGNOSIS — I2510 Atherosclerotic heart disease of native coronary artery without angina pectoris: Secondary | ICD-10-CM | POA: Insufficient documentation

## 2023-03-08 DIAGNOSIS — Z5181 Encounter for therapeutic drug level monitoring: Secondary | ICD-10-CM | POA: Insufficient documentation

## 2023-03-08 DIAGNOSIS — F900 Attention-deficit hyperactivity disorder, predominantly inattentive type: Secondary | ICD-10-CM

## 2023-03-08 DIAGNOSIS — E785 Hyperlipidemia, unspecified: Secondary | ICD-10-CM | POA: Insufficient documentation

## 2023-03-08 DIAGNOSIS — I10 Essential (primary) hypertension: Secondary | ICD-10-CM | POA: Insufficient documentation

## 2023-03-08 DIAGNOSIS — F988 Other specified behavioral and emotional disorders with onset usually occurring in childhood and adolescence: Secondary | ICD-10-CM | POA: Insufficient documentation

## 2023-03-08 DIAGNOSIS — E119 Type 2 diabetes mellitus without complications: Secondary | ICD-10-CM | POA: Insufficient documentation

## 2023-03-08 DIAGNOSIS — I2699 Other pulmonary embolism without acute cor pulmonale: Secondary | ICD-10-CM | POA: Insufficient documentation

## 2023-03-12 ENCOUNTER — Ambulatory Visit
Payer: Medicaid Other | Attending: INTERNAL MEDICINE-ENDOCRINOLOGY-DIABETES AND METABOLISM | Admitting: INTERNAL MEDICINE-ENDOCRINOLOGY-DIABETES AND METABOLISM

## 2023-03-12 ENCOUNTER — Other Ambulatory Visit: Payer: Self-pay

## 2023-03-12 ENCOUNTER — Encounter (HOSPITAL_BASED_OUTPATIENT_CLINIC_OR_DEPARTMENT_OTHER): Payer: Self-pay | Admitting: INTERNAL MEDICINE-ENDOCRINOLOGY-DIABETES AND METABOLISM

## 2023-03-12 VITALS — BP 120/70 | HR 87 | Temp 97.5°F | Resp 16 | Ht 73.0 in | Wt 233.0 lb

## 2023-03-12 DIAGNOSIS — E119 Type 2 diabetes mellitus without complications: Secondary | ICD-10-CM | POA: Insufficient documentation

## 2023-03-12 DIAGNOSIS — E782 Mixed hyperlipidemia: Secondary | ICD-10-CM | POA: Insufficient documentation

## 2023-03-12 DIAGNOSIS — Z7985 Long-term (current) use of injectable non-insulin antidiabetic drugs: Secondary | ICD-10-CM

## 2023-03-12 DIAGNOSIS — Z794 Long term (current) use of insulin: Secondary | ICD-10-CM

## 2023-03-12 DIAGNOSIS — I10 Essential (primary) hypertension: Secondary | ICD-10-CM | POA: Insufficient documentation

## 2023-03-12 NOTE — Progress Notes (Signed)
ENDOCRINOLOGY, [facility]  [address]  [place] [address]  Operated by [facility]     Name: Shawn Zhang MRN:  [id number]   Date of Birth: [birth year] Age: 52 y.o.   Date: 03/12/2023         Reason for visit: Diabetes (Last office visit 11/15/22)      Objective:  Shawn Zhang is a 52 y.o. male presenting with type 2 diabetes.  He is taking Ozempic 1 mg a week and Lantus 50 units a day  He has over eaten a few times and has felt miserable.  Weight stable.  Glucoses well controlled.  This am glucose 73 and highest 189.  In February Hba1c 6.3%  no recent labs.  .Eye Dr he went in February and good report. No diabetic changes.      Past Medical History:  Past Medical History:   Diagnosis Date    ADHD (attention deficit hyperactivity disorder)     CAD (coronary artery disease)     DM (diabetes mellitus) (CMS HCC)     DVT (deep venous thrombosis) (CMS HCC)     History of heart attack     HTN (hypertension)     Hyperlipidemia     Pulmonary emboli (CMS HCC)     Right knee pain           No Known Allergies  Current Outpatient Medications   Medication Sig    aspirin (ECOTRIN) 81 mg Oral Tablet, Delayed Release (E.C.) Take 1 Tablet (81 mg total) by mouth Every morning    atorvastatin (LIPITOR) 40 mg Oral Tablet Take 1 Tablet (40 mg total) by mouth Every night    BD UF MINI PEN NEEDLE 31 gauge x 3/16" Needle     dextroamphetamine-amphetamine (ADDERALL) 30 mg Oral Tablet Take 1 Tablet (30 mg total) by mouth Twice daily    ELIQUIS 5 mg Oral Tablet Take 1 Tablet (5 mg total) by mouth Twice daily    LANTUS SOLOSTAR U-100 INSULIN 100 unit/mL (3 mL) Subcutaneous Insulin Pen     lisinopriL (PRINIVIL) 10 mg Oral Tablet     OZEMPIC 1 mg/dose (4 mg/3 mL) Subcutaneous Pen Injector     TRUE METRIX GLUCOSE TEST STRIP Does not apply Strip USE AS DIRECTED TO CHECK BLOOD GLUCOSE LEVELS 3 TIMES DAILY           Review of Systems:  The pertinent ROS as addressed in the HPI.    Physical  Exam:  Vitals:   Vitals:    03/12/23 1051   BP: 120/70   Pulse: 87   Resp: 16   Temp: 36.4 C (97.5 F)   TempSrc: Temporal   SpO2: 98%   Weight: 106 kg (233 lb)   Height: 1.854 m (6\' 1" )   BMI: 30.8     Physical Exam  HENT:      Head: Normocephalic and atraumatic.   Eyes:      Extraocular Movements: Extraocular movements intact.      Conjunctiva/sclera: Conjunctivae normal.   Neck:      Thyroid: No thyromegaly.   Cardiovascular:      Rate and Rhythm: Normal rate and regular rhythm.      Heart sounds: No murmur heard.  Pulmonary:      Effort: Pulmonary effort is normal. No respiratory distress.      Breath sounds: Normal breath sounds.   Musculoskeletal:      Cervical back: No tenderness.   Lymphadenopathy:  Cervical: No cervical adenopathy.   Neurological:      Mental Status: He is alert and oriented to person, place, and time.   Psychiatric:         Mood and Affect: Mood normal.        Assessment and Plan:    ICD-10-CM    1. Type II diabetes mellitus (CMS HCC)  E11.9 COMPREHENSIVE METABOLIC PANEL, NON-FASTING     LIPID PANEL     HGA1C (HEMOGLOBIN A1C WITH EST AVG GLUCOSE)      2. Mixed hyperlipidemia  E78.2 LIPID PANEL      3. Essential hypertension  I10       Continue current medications  Labs ordered and we will call him with results  Call the office with any questions or concerns prior to next appt.        Follow Up:  Return in about 3 months (around 06/12/2023).     Anastasio Champion, MD

## 2023-03-25 ENCOUNTER — Encounter (HOSPITAL_BASED_OUTPATIENT_CLINIC_OR_DEPARTMENT_OTHER): Payer: Self-pay | Admitting: NURSE PRACTITIONER

## 2023-03-25 ENCOUNTER — Ambulatory Visit: Payer: Medicaid Other | Attending: NURSE PRACTITIONER | Admitting: NURSE PRACTITIONER

## 2023-03-25 ENCOUNTER — Other Ambulatory Visit: Payer: Self-pay

## 2023-03-25 ENCOUNTER — Ambulatory Visit (HOSPITAL_BASED_OUTPATIENT_CLINIC_OR_DEPARTMENT_OTHER): Payer: Self-pay | Admitting: Psychiatric/Mental Health

## 2023-03-25 VITALS — BP 119/76 | HR 89

## 2023-03-25 DIAGNOSIS — F419 Anxiety disorder, unspecified: Secondary | ICD-10-CM

## 2023-03-25 DIAGNOSIS — F900 Attention-deficit hyperactivity disorder, predominantly inattentive type: Secondary | ICD-10-CM | POA: Insufficient documentation

## 2023-03-25 DIAGNOSIS — Z79899 Other long term (current) drug therapy: Secondary | ICD-10-CM | POA: Insufficient documentation

## 2023-03-25 MED ORDER — DEXTROAMPHETAMINE-AMPHETAMINE 30 MG TABLET
30.00 mg | ORAL_TABLET | Freq: Two times a day (BID) | ORAL | 0 refills | Status: DC
Start: 2023-03-25 — End: 2023-05-07

## 2023-03-25 NOTE — Progress Notes (Signed)
BEHAVIORAL MEDICINE, MEDICAL OFFICE [facility]  [address]  [place] [address]  Operated by [facility]     Progress Note    Name: Shanna Strength MRN:  [id number]   Date: 03/25/2023 Age: 52 y.o.      Chief Complaint:   Chief Complaint              Anxiety           History of Present Illness:    Ahmet Schank is a 52 y.o. male with a hx of ADHD.  He was last seen by Joni Reining on 11/19/22.  At that time he felt like 20 mg of Adderall wasn't helping as much as it once did.  It was later increased to 30 mg BID.  He states he has had trouble getting it at times and has had to get it as different pharmacies at times due to the shortage.  He states he feels like 30 mg is helping him with his concentration and focus.  He states his moods are good.  He states he is sleeping well.  Appetite is decreased but this is secondary to him being on Ozempic.  Patient denies any suicidal or homicidal ideation, intent or plan.       Past Medical History:  Past Medical History:   Diagnosis Date    ADHD (attention deficit hyperactivity disorder)     CAD (coronary artery disease)     DM (diabetes mellitus) (CMS HCC)     DVT (deep venous thrombosis) (CMS HCC)     History of heart attack     HTN (hypertension)     Hyperlipidemia     Pulmonary emboli (CMS HCC)     Right knee pain         Past Surgical History:   has a past surgical history that includes Replacement total knee; colonoscopy; hx lap cholecystectomy; Umbilical hernia repair; and hx coronary artery bypass graft.      Current Medications:  Current Outpatient Medications   Medication Sig    aspirin (ECOTRIN) 81 mg Oral Tablet, Delayed Release (E.C.) Take 1 Tablet (81 mg total) by mouth Every morning    atorvastatin (LIPITOR) 40 mg Oral Tablet Take 1 Tablet (40 mg total) by mouth Every night    BD UF MINI PEN NEEDLE 31 gauge x 3/16" Needle     dextroamphetamine-amphetamine (ADDERALL) 30 mg Oral Tablet Take 1 Tablet (30 mg total) by mouth Twice daily     ELIQUIS 5 mg Oral Tablet Take 1 Tablet (5 mg total) by mouth Twice daily    LANTUS SOLOSTAR U-100 INSULIN 100 unit/mL (3 mL) Subcutaneous Insulin Pen     lisinopriL (PRINIVIL) 10 mg Oral Tablet     OZEMPIC 1 mg/dose (4 mg/3 mL) Subcutaneous Pen Injector     TRUE METRIX GLUCOSE TEST STRIP Does not apply Strip USE AS DIRECTED TO CHECK BLOOD GLUCOSE LEVELS 3 TIMES DAILY       Allergies:  Allergies as of 03/25/2023    (No Known Allergies)       Social History:  Social History     Socioeconomic History    Marital status: Legally Separated     Spouse name: Not on file    Number of children: Not on file    Years of education: Not on file    Highest education level: Not on file   Occupational History     Employer: [employer]   Tobacco Use  Smoking status: Former     Current packs/day: 0.50     Average packs/day: 0.5 packs/day for 20.0 years (10.0 ttl pk-yrs)     Types: Cigarettes    Smokeless tobacco: Never   Vaping Use    Vaping status: Never Used   Substance and Sexual Activity    Alcohol use: Yes     Comment: 1-2 times/year    Drug use: Never    Sexual activity: Not on file   Other Topics Concern    Not on file   Social History Narrative    Not on file     Social Determinants of Health     Financial Resource Strain: Not on file   Transportation Needs: Not on file   Social Connections: Not on file   Intimate Partner Violence: Not on file   Housing Stability: Not on file        Family History:  Family Medical History:       Problem Relation (Age of Onset)    Diabetes Mother        Review of Systems:   Review of Systems as described in the HPI.    Vitals:    03/25/23 1031   BP: 119/76   Pulse: 89   SpO2: 100%       Mental Status Exam:  Oriented to person, place and time.  Well groomed and with good hygiene.  Good eye contact.  Behavior was pleasant and cooperative.  Speech is clear with regular rate and volume.  Mood reported to be good.  Affect was appropriate to situation.  Thought process was linear and  logical.  Cognition without impairment.  Insight and judgment without gross impairment.  Perceptual without visual or auditory hallucination or delusions.  Denies suicidal or homicidal ideation.  No intention or plan.         PHQ Questionnaire  Little interest or pleasure in doing things.: Not at all  Feeling down, depressed, or hopeless: Not at all  PHQ 2 Total: 0      Assessment:  ENCOUNTER DIAGNOSES     ICD-10-CM   1. ADHD, predominantly inattentive type  F90.0       Plan:    Orders Placed This Encounter    dextroamphetamine-amphetamine (ADDERALL) 30 mg Oral Tablet      Discussed risks and potential adverse effects of stimulant medications, including anxiety, problems sleeping and other stimulating side effects, drug to drug interactions, developing tolerance and risk of abuse/addiction and risk of withdrawal.  Patient verbalized understanding and provided consent.  Controlled substance agreement has been signed.    Advised to call our office with any questions or concerns prior to next appointment.    Provided supportive therapy and psychoeducation.     Patient is free of any thoughts, intent, plan, means or gestures to harm self or others.  Patient also agrees to seek help by calling 911 or going to the ER if feeling unsafe or if suicidal thoughts/plan occur.  Patient is aware of risks and need for follow up.     F/U 4 months or sooner if needed    Janith Lima, APRN,FNP-BC

## 2023-03-25 NOTE — Nursing Note (Signed)
03/25/23 1029   PHQ 9 (follow up)   Little interest or pleasure in doing things. 0   Feeling down, depressed, or hopeless 0

## 2023-04-04 ENCOUNTER — Other Ambulatory Visit (HOSPITAL_BASED_OUTPATIENT_CLINIC_OR_DEPARTMENT_OTHER): Payer: Self-pay | Admitting: INTERNAL MEDICINE-ENDOCRINOLOGY-DIABETES AND METABOLISM

## 2023-04-05 ENCOUNTER — Other Ambulatory Visit (HOSPITAL_BASED_OUTPATIENT_CLINIC_OR_DEPARTMENT_OTHER): Payer: Self-pay | Admitting: NURSE PRACTITIONER

## 2023-04-08 ENCOUNTER — Other Ambulatory Visit (HOSPITAL_BASED_OUTPATIENT_CLINIC_OR_DEPARTMENT_OTHER): Payer: Self-pay | Admitting: NURSE PRACTITIONER

## 2023-04-09 ENCOUNTER — Other Ambulatory Visit (HOSPITAL_BASED_OUTPATIENT_CLINIC_OR_DEPARTMENT_OTHER): Payer: Self-pay | Admitting: NURSE PRACTITIONER

## 2023-04-09 ENCOUNTER — Telehealth (HOSPITAL_BASED_OUTPATIENT_CLINIC_OR_DEPARTMENT_OTHER): Payer: Self-pay | Admitting: NURSE PRACTITIONER

## 2023-04-09 NOTE — Telephone Encounter (Signed)
Disregard the last med request.. The patient was trying to pick up the rx too early.Marland Kitchen

## 2023-04-14 NOTE — Progress Notes (Signed)
 FAMILY MEDICINE, [facility] PRIMARY CARE  [address]  [place] [address]  Operated by [facility]    Name: Shawn Zhang MRN:  [id number]   Date: 04/16/2023 DOB:  27-Apr-1971 (52 y.o.)      Provider: Bari DELENA Mires, DO  PCP: Bari DELENA Mires, DO  Referring Provider: Bari DELENA Mires     Reason for visit: Follow Up    History of Present Illness:   Shawn Zhang is a 53 y.o. male     Follows with Dr. Viki for DM2 and seen 11/2022.  Hba1c 6.3 (5.9); nl alb/cr ratio 11/2022.  He stays active.  Has been watching diet.  He is not active.  Eye exam is UTD.    He follows with cardiologist at [facility] for CAD.  He has h/o stents x2.  No chest pain or shortness of breath.    LDL 27 (32); trigs 119 (120); HDL 29 (29) 11/2022.  He is on atorvastatin .    He takes Eliquis  for h/o DVT and PE in 2019.  Was seen by heme/onc with no explanation.    Follows with [facility] psychiatry for ADHD and seen 03/2023.    Had c-scope by Dr. Gifford 10/2020 with repeat 66yrs.  No GI complaints.    Declines flu vaccine.  He has not had Shingrix.  He had COVID vaccine.     Past Medical History:     Past Medical History:   Diagnosis Date    ADHD (attention deficit hyperactivity disorder)     CAD (coronary artery disease)     DM (diabetes mellitus) (CMS HCC)     DVT (deep venous thrombosis) (CMS HCC)     History of heart attack     HTN (hypertension)     Hyperlipidemia     Pulmonary emboli (CMS HCC)     Right knee pain            Past Surgical History:     Past Surgical History:   Procedure Laterality Date    ANKLE SURGERY      COLONOSCOPY  10/28/2020    HX CORONARY STENT PLACEMENT      HX LAP CHOLECYSTECTOMY      REPLACEMENT TOTAL KNEE      UMBILICAL HERNIA REPAIR        Allergies:   No Known Allergies  Medications:     Current Outpatient Medications   Medication Sig    aspirin (ECOTRIN) 81 mg Oral Tablet, Delayed Release (E.C.) Take 1 Tablet (81 mg total) by mouth Every morning    atorvastatin  (LIPITOR) 40 mg  Oral Tablet Take 1 Tablet (40 mg total) by mouth Every night    BD UF MINI PEN NEEDLE 31 gauge x 3/16 Needle     dextroamphetamine -amphetamine  (ADDERALL) 30 mg Oral Tablet Take 1 Tablet (30 mg total) by mouth Twice daily    ELIQUIS  5 mg Oral Tablet Take 1 Tablet (5 mg total) by mouth Twice daily    LANTUS  SOLOSTAR U-100 INSULIN  100 unit/mL (3 mL) Subcutaneous Insulin  Pen     lisinopriL  (PRINIVIL ) 10 mg Oral Tablet     OZEMPIC  1 mg/dose (4 mg/3 mL) Subcutaneous Pen Injector     TRUE METRIX GLUCOSE TEST STRIP Does not apply Strip USE AS DIRECTED TO CHECK BLOOD GLUCOSE LEVELS 3 TIMES DAILY     Family History:     Family Medical History:       Problem Relation (Age of Onset)  Diabetes Mother            Social History:     Social History     Socioeconomic History    Marital status: Legally Separated   Occupational History     Employer: [employer] officer   Tobacco Use    Smoking status: Former     Current packs/day: 0.50     Average packs/day: 0.5 packs/day for 20.0 years (10.0 ttl pk-yrs)     Types: Cigarettes    Smokeless tobacco: Never   Vaping Use    Vaping status: Never Used   Substance and Sexual Activity    Alcohol use: Yes     Comment: 1-2 times/year    Drug use: Never     Social Determinants of Health     Financial Resource Strain: Low Risk  (04/16/2023)    Financial Resource Strain     SDOH Financial: No   Transportation Needs: Low Risk  (04/16/2023)    Transportation Needs     SDOH Transportation: No   Social Connections: Low Risk  (04/16/2023)    Social Connections     SDOH Social Isolation: 5 or more times a week   Intimate Partner Violence: High Risk (04/16/2023)    Intimate Partner Violence     SDOH Domestic Violence: No   Housing Stability: Low Risk  (04/16/2023)    Housing Stability     SDOH Housing Situation: I have housing.     SDOH Housing Worry: No     Review of Systems:   Review of Systems   Constitutional: Negative.    HENT: Negative.     Eyes: Negative.    Respiratory: Negative.     Cardiovascular:  Negative.    Gastrointestinal: Negative.    Genitourinary: Negative.    Musculoskeletal: Negative.    Skin: Negative.    Neurological: Negative.    Psychiatric/Behavioral: Negative.        Physical Exam:   Vital Signs: BP 125/73   Pulse 85   Temp 36.6 C (97.8 F)   Resp 16   Ht 1.854 m (6' 1)   Wt 107 kg (235 lb)   SpO2 97%   BMI 31.00 kg/m        Physical Exam  Constitutional:       Appearance: Normal appearance.   HENT:      Head: Normocephalic and atraumatic.   Eyes:      Extraocular Movements: Extraocular movements intact.   Neck:      Vascular: No carotid bruit.   Cardiovascular:      Rate and Rhythm: Normal rate and regular rhythm.      Pulses: Normal pulses.      Heart sounds: Normal heart sounds.   Pulmonary:      Breath sounds: Normal breath sounds.   Abdominal:      General: There is no distension.      Palpations: Abdomen is soft.      Tenderness: There is no abdominal tenderness. There is no guarding or rebound.   Musculoskeletal:      Cervical back: Neck supple.   Skin:     General: Skin is warm and dry.   Neurological:      Mental Status: He is alert and oriented to person, place, and time.   Psychiatric:         Mood and Affect: Mood normal.       Assessment:       ICD-10-CM    1. DM (  diabetes mellitus) (CMS HCC)  E11.9       2. Coronary artery disease, unspecified vessel or lesion type, unspecified whether angina present, unspecified whether native or transplanted heart  I25.10       3. Hypertension, unspecified type  I10       4. Hyperlipidemia, unspecified hyperlipidemia type  E78.5       5. ADHD (attention deficit hyperactivity disorder)  F90.9       6. Pulmonary embolism, unspecified chronicity, unspecified pulmonary embolism type, unspecified whether acute cor pulmonale present (CMS HCC)  I26.99       7. DVT (deep venous thrombosis) (CMS HCC)  I82.409          Plan:   Keep appointments with specialists.  Exercise daily.  Continue compression stockings.   No orders of the defined types  were placed in this encounter.                    Return in about 6 months (around 10/17/2023).    Imogene Gravelle A Buchko, DO

## 2023-04-16 ENCOUNTER — Encounter (HOSPITAL_BASED_OUTPATIENT_CLINIC_OR_DEPARTMENT_OTHER): Payer: Self-pay | Admitting: Family Medicine

## 2023-04-16 ENCOUNTER — Other Ambulatory Visit: Payer: Self-pay

## 2023-04-16 ENCOUNTER — Ambulatory Visit: Payer: Medicaid Other | Attending: Family Medicine | Admitting: Family Medicine

## 2023-04-16 VITALS — BP 125/73 | HR 85 | Temp 97.8°F | Resp 16 | Ht 73.0 in | Wt 235.0 lb

## 2023-04-16 DIAGNOSIS — Z794 Long term (current) use of insulin: Secondary | ICD-10-CM

## 2023-04-16 DIAGNOSIS — I82409 Acute embolism and thrombosis of unspecified deep veins of unspecified lower extremity: Secondary | ICD-10-CM | POA: Insufficient documentation

## 2023-04-16 DIAGNOSIS — I10 Essential (primary) hypertension: Secondary | ICD-10-CM | POA: Insufficient documentation

## 2023-04-16 DIAGNOSIS — Z7901 Long term (current) use of anticoagulants: Secondary | ICD-10-CM

## 2023-04-16 DIAGNOSIS — Z7985 Long-term (current) use of injectable non-insulin antidiabetic drugs: Secondary | ICD-10-CM

## 2023-04-16 DIAGNOSIS — Z86711 Personal history of pulmonary embolism: Secondary | ICD-10-CM

## 2023-04-16 DIAGNOSIS — Z79899 Other long term (current) drug therapy: Secondary | ICD-10-CM

## 2023-04-16 DIAGNOSIS — E785 Hyperlipidemia, unspecified: Secondary | ICD-10-CM | POA: Insufficient documentation

## 2023-04-16 DIAGNOSIS — F909 Attention-deficit hyperactivity disorder, unspecified type: Secondary | ICD-10-CM | POA: Insufficient documentation

## 2023-04-16 DIAGNOSIS — I2699 Other pulmonary embolism without acute cor pulmonale: Secondary | ICD-10-CM | POA: Insufficient documentation

## 2023-04-16 DIAGNOSIS — E119 Type 2 diabetes mellitus without complications: Secondary | ICD-10-CM | POA: Insufficient documentation

## 2023-04-16 DIAGNOSIS — I2510 Atherosclerotic heart disease of native coronary artery without angina pectoris: Secondary | ICD-10-CM | POA: Insufficient documentation

## 2023-04-16 DIAGNOSIS — Z86718 Personal history of other venous thrombosis and embolism: Secondary | ICD-10-CM

## 2023-04-21 ENCOUNTER — Other Ambulatory Visit: Payer: Self-pay

## 2023-05-07 ENCOUNTER — Other Ambulatory Visit (HOSPITAL_BASED_OUTPATIENT_CLINIC_OR_DEPARTMENT_OTHER): Payer: Self-pay | Admitting: NURSE PRACTITIONER

## 2023-05-07 MED ORDER — DEXTROAMPHETAMINE-AMPHETAMINE 30 MG TABLET
30.0000 mg | ORAL_TABLET | Freq: Two times a day (BID) | ORAL | 0 refills | Status: DC
Start: 2023-05-07 — End: 2023-06-05

## 2023-05-21 ENCOUNTER — Other Ambulatory Visit: Payer: Self-pay

## 2023-06-05 ENCOUNTER — Other Ambulatory Visit (HOSPITAL_BASED_OUTPATIENT_CLINIC_OR_DEPARTMENT_OTHER): Payer: Self-pay | Admitting: NURSE PRACTITIONER

## 2023-06-05 MED ORDER — DEXTROAMPHETAMINE-AMPHETAMINE 30 MG TABLET
30.0000 mg | ORAL_TABLET | Freq: Two times a day (BID) | ORAL | 0 refills | Status: DC
Start: 2023-06-05 — End: 2023-07-05

## 2023-06-11 ENCOUNTER — Ambulatory Visit: Payer: Medicaid Other | Attending: NURSE PRACTITIONER | Admitting: NURSE PRACTITIONER

## 2023-06-11 ENCOUNTER — Encounter (HOSPITAL_BASED_OUTPATIENT_CLINIC_OR_DEPARTMENT_OTHER): Payer: Self-pay | Admitting: NURSE PRACTITIONER

## 2023-06-11 ENCOUNTER — Other Ambulatory Visit: Payer: Self-pay

## 2023-06-11 VITALS — BP 110/82 | HR 98 | Temp 98.6°F | Resp 16 | Ht 73.0 in | Wt 231.8 lb

## 2023-06-11 DIAGNOSIS — Z9189 Other specified personal risk factors, not elsewhere classified: Secondary | ICD-10-CM | POA: Insufficient documentation

## 2023-06-11 DIAGNOSIS — E669 Obesity, unspecified: Secondary | ICD-10-CM | POA: Insufficient documentation

## 2023-06-11 DIAGNOSIS — R739 Hyperglycemia, unspecified: Secondary | ICD-10-CM | POA: Insufficient documentation

## 2023-06-11 DIAGNOSIS — Z720 Tobacco use: Secondary | ICD-10-CM | POA: Insufficient documentation

## 2023-06-11 DIAGNOSIS — W11XXXA Fall on and from ladder, initial encounter: Secondary | ICD-10-CM | POA: Insufficient documentation

## 2023-06-11 DIAGNOSIS — Z7901 Long term (current) use of anticoagulants: Secondary | ICD-10-CM | POA: Insufficient documentation

## 2023-06-11 DIAGNOSIS — Z86711 Personal history of pulmonary embolism: Secondary | ICD-10-CM | POA: Insufficient documentation

## 2023-06-11 DIAGNOSIS — E789 Disorder of lipoprotein metabolism, unspecified: Secondary | ICD-10-CM | POA: Insufficient documentation

## 2023-06-11 DIAGNOSIS — L03116 Cellulitis of left lower limb: Secondary | ICD-10-CM | POA: Insufficient documentation

## 2023-06-11 DIAGNOSIS — Z23 Encounter for immunization: Secondary | ICD-10-CM | POA: Insufficient documentation

## 2023-06-11 DIAGNOSIS — S81832A Puncture wound without foreign body, left lower leg, initial encounter: Secondary | ICD-10-CM | POA: Insufficient documentation

## 2023-06-11 DIAGNOSIS — Z7982 Long term (current) use of aspirin: Secondary | ICD-10-CM | POA: Insufficient documentation

## 2023-06-11 DIAGNOSIS — Z96651 Presence of right artificial knee joint: Secondary | ICD-10-CM | POA: Insufficient documentation

## 2023-06-11 DIAGNOSIS — R52 Pain, unspecified: Secondary | ICD-10-CM | POA: Insufficient documentation

## 2023-06-11 MED ORDER — DOXYCYCLINE HYCLATE 100 MG CAPSULE
100.0000 mg | ORAL_CAPSULE | Freq: Two times a day (BID) | ORAL | 0 refills | Status: AC
Start: 2023-06-11 — End: 2023-06-21

## 2023-06-11 NOTE — Patient Instructions (Signed)
Patient given tetanus vaccine today.  Patient to complete antibiotics as prescribed.   Patient to apply warm compresses to site. Return to clinic if site begins to drain, return to clinic for wound culture.   Monitor for signs/symptoms of worsening infection. Return to clinic with any increased redness, red streaking, drainage, tenderness/warmth to site, or fever/chills.   Cleanse site with mild soap and water then pat dry. May apply Bacitracin ointment to site after cleansing.   Increase fluid intake.   Medications were discussed and patient understands the risks,potential side effects, and benefits of their medications.   Patient to continue current medications as prescribed.   Patient instructed to go to the ER with any worsening of symptoms, chest pain, palpitations, shortness of breath, AMS, fever >104, lethargy, or severe pain.

## 2023-06-11 NOTE — Progress Notes (Signed)
RAPID CARE, [facility]  [address] [place] [address]  Operated by [facility]     Name: Shawn Zhang MRN:  [id number]   Date: 06/11/2023 Age: 52 y.o.       Reason for Visit: Fall (Patient states he fell on Friday and is c/o left leg and ankle pain with swelling, warm to touch, and redness.)    History of Present Illness:   Shawn Zhang is a 52 y.o. male who is being seen today for   Wound Infection  This is a new problem. Episode onset: x 5 days. The problem occurs constantly. The problem has been unchanged. Pertinent negatives include no chest pain, chills, fatigue, fever or swollen glands. Nothing aggravates the symptoms. He has tried nothing for the symptoms.     Patient reports he fell off an attic ladder on Friday and is unsure what he hit his leg on, but he had a wound to the site and now it is red, warm, and swollen. Reports the area did not bleed. Reports he is not up to date regarding his tetanus vaccine.    PFSH:     Past Medical History:   Diagnosis Date    ADHD (attention deficit hyperactivity disorder)     CAD (coronary artery disease)     DM (diabetes mellitus) (CMS HCC)     DVT (deep venous thrombosis) (CMS HCC)     History of heart attack     HTN (hypertension)     Hyperlipidemia     Pulmonary emboli (CMS HCC)     Right knee pain       Past Surgical History:   Procedure Laterality Date    ANKLE SURGERY      COLONOSCOPY  10/28/2020    HX CORONARY STENT PLACEMENT      HX LAP CHOLECYSTECTOMY      REPLACEMENT TOTAL KNEE      UMBILICAL HERNIA REPAIR         No Known Allergies    Current Outpatient Medications   Medication Sig    aspirin (ECOTRIN) 81 mg Oral Tablet, Delayed Release (E.C.) Take 1 Tablet (81 mg total) by mouth Every morning    atorvastatin (LIPITOR) 40 mg Oral Tablet Take 1 Tablet (40 mg total) by mouth Every night    BD UF MINI PEN NEEDLE 31 gauge x 3/16" Needle     dextroamphetamine-amphetamine (ADDERALL) 30 mg Oral Tablet Take 1 Tablet (30 mg total) by mouth  Twice daily    doxycycline hyclate (VIBRAMYCIN) 100 mg Oral Capsule Take 1 Capsule (100 mg total) by mouth Twice daily for 10 days    ELIQUIS 5 mg Oral Tablet Take 1 Tablet (5 mg total) by mouth Twice daily    LANTUS SOLOSTAR U-100 INSULIN 100 unit/mL (3 mL) Subcutaneous Insulin Pen     lisinopriL (PRINIVIL) 10 mg Oral Tablet     OZEMPIC 1 mg/dose (4 mg/3 mL) Subcutaneous Pen Injector     TRUE METRIX GLUCOSE TEST STRIP Does not apply Strip USE AS DIRECTED TO CHECK BLOOD GLUCOSE LEVELS 3 TIMES DAILY     Family Medical History:       Problem Relation (Age of Onset)    Diabetes Mother            Social History     Tobacco Use    Smoking status: Former     Current packs/day: 0.50     Average packs/day: 0.5 packs/day for 20.0 years (10.0  ttl pk-yrs)     Types: Cigarettes    Smokeless tobacco: Never   Vaping Use    Vaping status: Never Used   Substance Use Topics    Alcohol use: Yes     Comment: 1-2 times/year    Drug use: Never       Screening:     Depression screening is negative. PHQ 2 Total: 0     Review of Systems:   Review of Systems   Constitutional:  Negative for chills, fatigue and fever.   Respiratory:  Negative for shortness of breath and wheezing.    Cardiovascular:  Negative for chest pain, palpitations and leg swelling.   Gastrointestinal:  Negative for constipation and diarrhea.   Skin:  Positive for color change and wound.         Physical Exam:   BP 110/82 (Site: Right Arm, Patient Position: Sitting, Cuff Size: Adult Large)   Pulse 98   Temp 37 C (98.6 F) (Skin)   Resp 16   Ht 1.854 m (6\' 1" )   Wt 105 kg (231 lb 12.8 oz)   SpO2 99%   BMI 30.58 kg/m       Physical Exam  Vitals and nursing note reviewed.   Constitutional:       General: He is not in acute distress.  HENT:      Head: Normocephalic and atraumatic.   Eyes:      Pupils: Pupils are equal, round, and reactive to light.   Cardiovascular:      Rate and Rhythm: Normal rate and regular rhythm.   Pulmonary:      Effort: Pulmonary effort is  normal.      Breath sounds: Normal breath sounds.   Musculoskeletal:         General: Normal range of motion.   Skin:     General: Skin is warm and dry.      Findings: Wound (quarter-sized area of induration to left lateral lower leg, center of induration with what appears to be a puncture wound, area is erythemic, area is tender and warm to touch, no fluctuance present, no discharge present) present.   Neurological:      Mental Status: He is alert and oriented to person, place, and time.   Psychiatric:         Mood and Affect: Mood normal.        Assessment and Plan:       ICD-10-CM    1. Cellulitis of left lower extremity  L03.116 TETANUS TOXOID/DIPHTHERIA TOXOID/ACELLULAR PERTUSSIS VACCINE,ADSORBED          Orders Placed This Encounter    TETANUS TOXOID/DIPHTHERIA TOXOID/ACELLULAR PERTUSSIS VACCINE,ADSORBED    doxycycline hyclate (VIBRAMYCIN) 100 mg Oral Capsule        Patient Instructions   Patient given tetanus vaccine today.  Patient to complete antibiotics as prescribed.   Patient to apply warm compresses to site. Return to clinic if site begins to drain, return to clinic for wound culture.   Monitor for signs/symptoms of worsening infection. Return to clinic with any increased redness, red streaking, drainage, tenderness/warmth to site, or fever/chills.   Cleanse site with mild soap and water then pat dry. May apply Bacitracin ointment to site after cleansing.   Increase fluid intake.   Medications were discussed and patient understands the risks,potential side effects, and benefits of their medications.   Patient to continue current medications as prescribed.   Patient instructed to go to the ER with any  worsening of symptoms, chest pain, palpitations, shortness of breath, AMS, fever >104, lethargy, or severe pain.        Follow Up:   Return if symptoms worsen or fail to improve.      Rinaldo Ratel, APRN,FNP-BC  06/11/2023, 09:48

## 2023-07-05 ENCOUNTER — Other Ambulatory Visit (HOSPITAL_BASED_OUTPATIENT_CLINIC_OR_DEPARTMENT_OTHER): Payer: Self-pay | Admitting: NURSE PRACTITIONER

## 2023-07-05 MED ORDER — DEXTROAMPHETAMINE-AMPHETAMINE 30 MG TABLET
30.0000 mg | ORAL_TABLET | Freq: Two times a day (BID) | ORAL | 0 refills | Status: DC
Start: 2023-07-05 — End: 2023-08-05

## 2023-08-02 ENCOUNTER — Other Ambulatory Visit (HOSPITAL_BASED_OUTPATIENT_CLINIC_OR_DEPARTMENT_OTHER): Payer: Self-pay | Admitting: NURSE PRACTITIONER

## 2023-08-05 ENCOUNTER — Other Ambulatory Visit (HOSPITAL_BASED_OUTPATIENT_CLINIC_OR_DEPARTMENT_OTHER): Payer: Self-pay | Admitting: NURSE PRACTITIONER

## 2023-08-05 ENCOUNTER — Telehealth (HOSPITAL_BASED_OUTPATIENT_CLINIC_OR_DEPARTMENT_OTHER): Payer: Self-pay | Admitting: NURSE PRACTITIONER

## 2023-08-05 MED ORDER — DEXTROAMPHETAMINE-AMPHETAMINE 30 MG TABLET
30.0000 mg | ORAL_TABLET | Freq: Two times a day (BID) | ORAL | 0 refills | Status: DC
Start: 2023-08-05 — End: 2023-09-04

## 2023-08-05 MED ORDER — DEXTROAMPHETAMINE-AMPHETAMINE 30 MG TABLET
30.0000 mg | ORAL_TABLET | Freq: Two times a day (BID) | ORAL | 0 refills | Status: DC
Start: 2023-08-05 — End: 2023-08-05

## 2023-08-05 NOTE — Telephone Encounter (Signed)
Pt called he said Adderall was sent to wrong pharmacy he said he told Corrie Dandy it was supposed to be the [facility] in [place].

## 2023-08-07 ENCOUNTER — Ambulatory Visit: Payer: Medicaid Other | Attending: NURSE PRACTITIONER | Admitting: NURSE PRACTITIONER

## 2023-08-07 ENCOUNTER — Encounter (HOSPITAL_BASED_OUTPATIENT_CLINIC_OR_DEPARTMENT_OTHER): Payer: Self-pay | Admitting: NURSE PRACTITIONER

## 2023-08-07 ENCOUNTER — Other Ambulatory Visit: Payer: Self-pay

## 2023-08-07 DIAGNOSIS — F419 Anxiety disorder, unspecified: Secondary | ICD-10-CM

## 2023-08-07 DIAGNOSIS — Z79899 Other long term (current) drug therapy: Secondary | ICD-10-CM | POA: Insufficient documentation

## 2023-08-07 DIAGNOSIS — F900 Attention-deficit hyperactivity disorder, predominantly inattentive type: Secondary | ICD-10-CM | POA: Insufficient documentation

## 2023-08-07 NOTE — Progress Notes (Signed)
BEHAVIORAL MEDICINE, [facility]  [address]  [place] [address]       Progress Note    Name: Shawn Zhang MRN:  [id number]   Date: 08/07/2023 Age: 52 y.o.      Chief Complaint:   Chief Complaint              Anxiety             History of Present Illness:    Khristopher Gonnerman is a 52 y.o. male with a hx of ADHD, predominantly inattentive.  Patient was last seen by me on 03/25/23.  At that time he was doing well and reported no problems.  He felt like the 30 mg of Adderall was still working well for him and no changes were made.  Patient states today that he continues to do well and his moods have been good.  He states he is still doing well with his current dose of Adderall.  He denies any problems with the medication other than having trouble getting it at times due to the shortage of the stimulant medications.  He stated they gave him brand name a few times and he noticed it worked better than the generic.  Patient states he is sleeping well and appetite is good.  Patient denies any suicidal or homicidal ideation, intent or plan.           08/07/2023    11:27 AM   Most Recent PHQ-9 Scores   PHQ 9 Total 0            04/16/2023    12:59 PM   GAD-7 Questionnaire   Feeling nervous,anxious,on edge 0   Not being able to stop or control worrying 0        Past Medical History:  Past Medical History:   Diagnosis Date    ADHD (attention deficit hyperactivity disorder)     CAD (coronary artery disease)     DM (diabetes mellitus) (CMS HCC)     DVT (deep venous thrombosis) (CMS HCC)     History of heart attack     HTN (hypertension)     Hyperlipidemia     Pulmonary emboli (CMS HCC)     Right knee pain         Past Surgical History:   has a past surgical history that includes Replacement total knee; colonoscopy (10/28/2020); hx lap cholecystectomy; Umbilical hernia repair; Ankle surgery; and hx coronary stent placement.      Current Medications:  Current Outpatient  Medications   Medication Sig    aspirin (ECOTRIN) 81 mg Oral Tablet, Delayed Release (E.C.) Take 1 Tablet (81 mg total) by mouth Every morning    atorvastatin (LIPITOR) 40 mg Oral Tablet Take 1 Tablet (40 mg total) by mouth Every night    BD UF MINI PEN NEEDLE 31 gauge x 3/16" Needle     dextroamphetamine-amphetamine (ADDERALL) 30 mg Oral Tablet Take 1 Tablet (30 mg total) by mouth Twice daily    ELIQUIS 5 mg Oral Tablet Take 1 Tablet (5 mg total) by mouth Twice daily    LANTUS SOLOSTAR U-100 INSULIN 100 unit/mL (3 mL) Subcutaneous Insulin Pen     lisinopriL (PRINIVIL) 10 mg Oral Tablet     OZEMPIC 1 mg/dose (4 mg/3 mL) Subcutaneous Pen Injector     TRUE METRIX GLUCOSE TEST STRIP Does not apply Strip USE AS DIRECTED TO CHECK BLOOD GLUCOSE LEVELS 3 TIMES DAILY  Allergies:  Allergies as of 08/07/2023    (No Known Allergies)       Social History:  Social History     Socioeconomic History    Marital status: Legally Separated     Spouse name: Not on file    Number of children: Not on file    Years of education: Not on file    Highest education level: Not on file   Occupational History     Employer: [employer]   Tobacco Use    Smoking status: Former     Current packs/day: 0.50     Average packs/day: 0.5 packs/day for 20.0 years (10.0 ttl pk-yrs)     Types: Cigarettes    Smokeless tobacco: Never   Vaping Use    Vaping status: Never Used   Substance and Sexual Activity    Alcohol use: Yes     Comment: 1-2 times/year    Drug use: Never    Sexual activity: Not on file   Other Topics Concern    Not on file   Social History Narrative    Not on file     Social Determinants of Health     Financial Resource Strain: Low Risk  (04/16/2023)    Financial Resource Strain     SDOH Financial: No   Transportation Needs: Low Risk  (04/16/2023)    Transportation Needs     SDOH Transportation: No   Social Connections: Low Risk  (04/16/2023)    Social Connections     SDOH Social Isolation: 5 or more times a week   Intimate Partner  Violence: High Risk (04/16/2023)    Intimate Partner Violence     SDOH Domestic Violence: No   Housing Stability: Low Risk  (04/16/2023)    Housing Stability     SDOH Housing Situation: I have housing.     SDOH Housing Worry: No        Family History:  Family Medical History:       Problem Relation (Age of Onset)    Diabetes Mother        Review of Systems:   Review of Systems as described in the HPI.      There were no vitals filed for this visit.      Mental Status Exam:  Oriented to person, place and time.  Well groomed and with good hygiene.  Good eye contact.  Behavior was pleasant and cooperative.  Speech is clear with regular rate and volume.  Mood reported to be good.  Affect was appropriate to situation.  Thought process was linear and logical.  Cognition without impairment.  Insight and judgment without gross impairment.  Perceptual without visual or auditory hallucination or delusions.  Denies suicidal or homicidal ideation.  No intention or plan.         PHQ Questionnaire  Little interest or pleasure in doing things.: Not at all  Feeling down, depressed, or hopeless: Not at all  PHQ 2 Total: 0  Trouble falling or staying asleep, or sleeping too much.: Not at all  Feeling tired or having little energy: Not at all  Poor appetite or overeating: Not at all  Feeling bad about yourself/ that you are a failure in the past 2 weeks?: Not at all  Trouble concentrating on things in the past 2 weeks?: Not at all  Moving/Speaking slowly or being fidgety or restless  in the past 2 weeks?: Not at all  Thoughts that you would be better off DEAD,  or of hurting yourself in some way.: Not at all  PHQ 9 Total: 0      Assessment:  ENCOUNTER DIAGNOSES     ICD-10-CM   1. ADHD (attention deficit hyperactivity disorder), inattentive type  F90.0       Plan:    Continue Adderall 30 mg BID.  Patient prefers brand name Adderall    Advised to call our office with any questions or concerns prior to next appointment.    Provided supportive  therapy and psychoeducation.     Patient is free of any thoughts, intent, plan, means or gestures to harm self or others.  Patient also agrees to seek help by calling 911 or going to the ER if feeling unsafe or if suicidal thoughts/plan occur.  Patient is aware of risks and need for follow up.     F/U 4 months or sooner if needed    Janith Lima, APRN,FNP-BC

## 2023-08-07 NOTE — Nursing Note (Signed)
08/07/23 1127   PHQ 9 (follow up)   Little interest or pleasure in doing things. 0   Feeling down, depressed, or hopeless 0   Trouble falling or staying asleep, or sleeping too much. 0   Feeling tired or having little energy 0   Poor appetite or overeating 0   Feeling bad about yourself/ that you are a failure in the past 2 weeks? 0   Trouble concentrating on things in the past 2 weeks? 0   Moving/Speaking slowly or being fidgety or restless  in the past 2 weeks? 0   Thoughts that you would be better off DEAD, or of hurting yourself in some way. 0   PHQ 9 Total 0

## 2023-09-04 ENCOUNTER — Other Ambulatory Visit (HOSPITAL_BASED_OUTPATIENT_CLINIC_OR_DEPARTMENT_OTHER): Payer: Self-pay | Admitting: NURSE PRACTITIONER

## 2023-09-04 MED ORDER — DEXTROAMPHETAMINE-AMPHETAMINE 30 MG TABLET
30.0000 mg | ORAL_TABLET | Freq: Two times a day (BID) | ORAL | 0 refills | Status: DC
Start: 2023-09-04 — End: 2023-10-03

## 2023-09-05 ENCOUNTER — Ambulatory Visit (HOSPITAL_BASED_OUTPATIENT_CLINIC_OR_DEPARTMENT_OTHER): Payer: Self-pay | Admitting: INTERNAL MEDICINE-ENDOCRINOLOGY-DIABETES AND METABOLISM

## 2023-09-27 ENCOUNTER — Other Ambulatory Visit: Payer: Self-pay

## 2023-09-27 ENCOUNTER — Ambulatory Visit
Payer: Medicaid Other | Attending: INTERNAL MEDICINE-ENDOCRINOLOGY-DIABETES AND METABOLISM | Admitting: INTERNAL MEDICINE-ENDOCRINOLOGY-DIABETES AND METABOLISM

## 2023-09-27 ENCOUNTER — Encounter (HOSPITAL_BASED_OUTPATIENT_CLINIC_OR_DEPARTMENT_OTHER): Payer: Self-pay | Admitting: INTERNAL MEDICINE-ENDOCRINOLOGY-DIABETES AND METABOLISM

## 2023-09-27 VITALS — BP 122/80 | HR 76 | Temp 97.7°F | Resp 16 | Ht 73.0 in | Wt 254.0 lb

## 2023-09-27 DIAGNOSIS — Z7901 Long term (current) use of anticoagulants: Secondary | ICD-10-CM

## 2023-09-27 DIAGNOSIS — Z794 Long term (current) use of insulin: Secondary | ICD-10-CM

## 2023-09-27 DIAGNOSIS — I10 Essential (primary) hypertension: Secondary | ICD-10-CM | POA: Insufficient documentation

## 2023-09-27 DIAGNOSIS — Z7985 Long-term (current) use of injectable non-insulin antidiabetic drugs: Secondary | ICD-10-CM

## 2023-09-27 DIAGNOSIS — E782 Mixed hyperlipidemia: Secondary | ICD-10-CM | POA: Insufficient documentation

## 2023-09-27 DIAGNOSIS — I2510 Atherosclerotic heart disease of native coronary artery without angina pectoris: Secondary | ICD-10-CM

## 2023-09-27 DIAGNOSIS — Z7902 Long term (current) use of antithrombotics/antiplatelets: Secondary | ICD-10-CM

## 2023-09-27 DIAGNOSIS — Z7982 Long term (current) use of aspirin: Secondary | ICD-10-CM

## 2023-09-27 DIAGNOSIS — E119 Type 2 diabetes mellitus without complications: Secondary | ICD-10-CM | POA: Insufficient documentation

## 2023-09-27 MED ORDER — BLOOD-GLUCOSE METER
1.0000 | Freq: Once | 0 refills | Status: AC
Start: 2023-09-27 — End: 2023-10-17

## 2023-09-27 MED ORDER — LANTUS SOLOSTAR U-100 INSULIN 100 UNIT/ML (3 ML) SUBCUTANEOUS PEN
50.0000 [IU] | PEN_INJECTOR | Freq: Every evening | SUBCUTANEOUS | 5 refills | Status: DC
Start: 2023-09-27 — End: 2024-03-30

## 2023-09-27 MED ORDER — SEMAGLUTIDE 0.25 MG OR 0.5 MG (2 MG/3 ML) SUBCUTANEOUS PEN INJECTOR
PEN_INJECTOR | SUBCUTANEOUS | 5 refills | Status: AC
Start: 2023-09-27 — End: 2024-04-24

## 2023-09-27 NOTE — Progress Notes (Signed)
ENDOCRINOLOGY, [facility]  [address]  [place] [address]  Operated by [facility]     Name: Shawn Zhang MRN:  [id number]   Date of Birth: [birth year] Age: 52 y.o.   Date: 09/27/2023         Reason for visit: Diabetes (Last office visit 03/12/23)      Objective:  Md Gory is a 52 y.o. male presenting with type 2 diabetes.  He wants to go back on Ozempic 0.5 mg because the 1 mg dosage caused nausea and severe constipation.  His cat knocked his glucose meter in the water bowl this morning.  Ozempic he last took in September .    Past Medical History:  Past Medical History:   Diagnosis Date    ADHD (attention deficit hyperactivity disorder)     CAD (coronary artery disease)     DM (diabetes mellitus) (CMS HCC)     DVT (deep venous thrombosis) (CMS HCC)     History of heart attack     HTN (hypertension)     Hyperlipidemia     Pulmonary emboli (CMS HCC)     Right knee pain           No Known Allergies  Current Outpatient Medications   Medication Sig    aspirin (ECOTRIN) 81 mg Oral Tablet, Delayed Release (E.C.) Take 1 Tablet (81 mg total) by mouth Every morning    atorvastatin (LIPITOR) 40 mg Oral Tablet Take 1 Tablet (40 mg total) by mouth Every night    BD UF MINI PEN NEEDLE 31 gauge x 3/16" Needle     Blood-Glucose Meter (TRUE METRIX GLUCOSE METER) Misc 1 Device One time for 1 dose    dextroamphetamine-amphetamine (ADDERALL) 30 mg Oral Tablet Take 1 Tablet (30 mg total) by mouth Twice daily    ELIQUIS 5 mg Oral Tablet Take 1 Tablet (5 mg total) by mouth Twice daily    LANTUS SOLOSTAR U-100 INSULIN 100 unit/mL (3 mL) Subcutaneous Insulin Pen Inject 50 Units under the skin Every night    lisinopriL (PRINIVIL) 10 mg Oral Tablet     semaglutide (OZEMPIC) 0.25 mg or 0.5 mg (2 mg/3 mL) Subcutaneous Pen Injector Inject 0.25 mg under the skin Every 7 days for 30 days, THEN 0.5 mg Every 7 days for 180 days.    TRUE METRIX GLUCOSE TEST STRIP Does not apply Strip USE AS  DIRECTED TO CHECK BLOOD GLUCOSE LEVELS 3 TIMES DAILY           Review of Systems:  The pertinent ROS as addressed in the HPI.    Physical Exam:  Vitals:   Vitals:    09/27/23 0849   BP: 122/80   Pulse: 76   Resp: 16   Temp: 36.5 C (97.7 F)   TempSrc: Temporal   SpO2: 99%   Weight: 115 kg (254 lb)   Height: 1.854 m (6\' 1" )   BMI: 33.51     Physical Exam  HENT:      Head: Normocephalic and atraumatic.   Eyes:      Extraocular Movements: Extraocular movements intact.      Conjunctiva/sclera: Conjunctivae normal.   Neck:      Thyroid: No thyromegaly.   Cardiovascular:      Rate and Rhythm: Normal rate and regular rhythm.      Heart sounds: No murmur heard.  Pulmonary:      Effort: Pulmonary effort is normal. No respiratory distress.  Breath sounds: Normal breath sounds.   Lymphadenopathy:      Cervical: No cervical adenopathy.   Neurological:      Mental Status: He is alert and oriented to person, place, and time.   Psychiatric:         Mood and Affect: Mood normal.          Assessment and Plan:    ICD-10-CM    1. Type II diabetes mellitus (CMS HCC)  E11.9 semaglutide (OZEMPIC) 0.25 mg or 0.5 mg (2 mg/3 mL) Subcutaneous Pen Injector     Blood-Glucose Meter (TRUE METRIX GLUCOSE METER) Misc     LIPID PANEL     COMPREHENSIVE METABOLIC PANEL, NON-FASTING     HGA1C (HEMOGLOBIN A1C WITH EST AVG GLUCOSE)      2. Mixed hyperlipidemia  E78.2       3. Essential hypertension  I10        Resume Ozempic 0.25 mg a week for 4 weeks then increase to 0.5 mg a week.   Lantus continue 50 units qhs  Let me know if you have hypoglycemia and we will decrease the Lantus dosage.     Follow Up:  Return in about 6 months (around 03/27/2024) for In Person Visit.     Anastasio Champion, MD

## 2023-10-02 ENCOUNTER — Other Ambulatory Visit (HOSPITAL_BASED_OUTPATIENT_CLINIC_OR_DEPARTMENT_OTHER): Payer: Self-pay | Admitting: INTERNAL MEDICINE-ENDOCRINOLOGY-DIABETES AND METABOLISM

## 2023-10-02 DIAGNOSIS — E119 Type 2 diabetes mellitus without complications: Secondary | ICD-10-CM

## 2023-10-03 ENCOUNTER — Other Ambulatory Visit (HOSPITAL_BASED_OUTPATIENT_CLINIC_OR_DEPARTMENT_OTHER): Payer: Self-pay | Admitting: NURSE PRACTITIONER

## 2023-10-03 MED ORDER — DEXTROAMPHETAMINE-AMPHETAMINE 30 MG TABLET
30.0000 mg | ORAL_TABLET | Freq: Two times a day (BID) | ORAL | 0 refills | Status: DC
Start: 2023-10-03 — End: 2023-11-01

## 2023-10-06 ENCOUNTER — Other Ambulatory Visit: Payer: Self-pay

## 2023-10-11 ENCOUNTER — Encounter (HOSPITAL_BASED_OUTPATIENT_CLINIC_OR_DEPARTMENT_OTHER): Payer: Self-pay

## 2023-10-16 NOTE — Progress Notes (Signed)
 FAMILY MEDICINE, [facility]  [address]  [place] [address]  Operated by [facility]    Name: Shawn Zhang MRN:  [id number]   Date: 10/17/2023 DOB:  April 10, 1971 (53 y.o.)      Provider: Arn Medal, DO  PCP: Arn Medal, DO  Referring Provider: No ref. provider found     Reason for visit: No chief complaint on file.    History of Present Illness:   Shawn Zhang is a 53 y.o. male     Follows with Dr. Jeannetta Ellis for DM2 and seen 09/2023.  Hba1c 8.0(6.3) 09/2023; nl alb/cr ratio 11/2022.  He stays active.  Has been watching diet.  He is not active.  Eye exam is UTD.    He follows with cardiologist at [facility] for CAD.  He has h/o stents x2.  No chest pain or shortness of breath.    LDL 52 (27); trigs 186 (119); HDL 34 (29) 09/2023.  He is on atorvastatin.    He takes Eliquis for h/o DVT and PE in 2019.  Was seen by heme/onc with no explanation.    Follows with [facility] psychiatry for ADHD and seen 07/2023.    Had c-scope by Dr. Domingo Madeira 10/2020 with repeat 70yrs.  No GI complaints.    Declines flu vaccine.  He has not had Shingrix.  He had COVID vaccine.     Past Medical History:     Past Medical History:   Diagnosis Date    ADHD (attention deficit hyperactivity disorder)     CAD (coronary artery disease)     DM (diabetes mellitus) (CMS HCC)     DVT (deep venous thrombosis) (CMS HCC)     History of heart attack     HTN (hypertension)     Hyperlipidemia     Pulmonary emboli (CMS HCC)     Right knee pain            Past Surgical History:     Past Surgical History:   Procedure Laterality Date    ANKLE SURGERY      COLONOSCOPY  10/28/2020    HX CORONARY STENT PLACEMENT      HX LAP CHOLECYSTECTOMY      REPLACEMENT TOTAL KNEE      UMBILICAL HERNIA REPAIR        Allergies:   No Known Allergies  Medications:     Current Outpatient Medications   Medication Sig    aspirin (ECOTRIN) 81 mg Oral Tablet, Delayed Release (E.C.) Take 1 Tablet (81 mg total) by mouth Every morning     atorvastatin (LIPITOR) 40 mg Oral Tablet Take 1 Tablet (40 mg total) by mouth Every night    BD UF MINI PEN NEEDLE 31 gauge x 3/16" Needle     dextroamphetamine-amphetamine (ADDERALL) 30 mg Oral Tablet Take 1 Tablet (30 mg total) by mouth Twice daily    ELIQUIS 5 mg Oral Tablet Take 1 Tablet (5 mg total) by mouth Twice daily    LANTUS SOLOSTAR U-100 INSULIN 100 unit/mL (3 mL) Subcutaneous Insulin Pen Inject 50 Units under the skin Every night    lisinopriL (PRINIVIL) 10 mg Oral Tablet     semaglutide (OZEMPIC) 0.25 mg or 0.5 mg (2 mg/3 mL) Subcutaneous Pen Injector Inject 0.25 mg under the skin Every 7 days for 30 days, THEN 0.5 mg Every 7 days for 180 days.    TRUE METRIX GLUCOSE TEST STRIP Does not apply Strip USE AS  DIRECTED TO CHECK BLOOD GLUCOSE LEVELS 3 TIMES DAILY     Family History:     Family Medical History:       Problem Relation (Age of Onset)    Diabetes Mother            Social History:     Social History     Socioeconomic History    Marital status: Legally Separated   Occupational History     Employer: [employer]   Tobacco Use    Smoking status: Former     Current packs/day: 0.50     Average packs/day: 0.5 packs/day for 20.0 years (10.0 ttl pk-yrs)     Types: Cigarettes    Smokeless tobacco: Never   Vaping Use    Vaping status: Never Used   Substance and Sexual Activity    Alcohol use: Yes     Comment: 1-2 times/year    Drug use: Never     Social Determinants of Health     Financial Resource Strain: Low Risk  (04/16/2023)    Financial Resource Strain     SDOH Financial: No   Transportation Needs: Low Risk  (04/16/2023)    Transportation Needs     SDOH Transportation: No   Social Connections: Low Risk  (04/16/2023)    Social Connections     SDOH Social Isolation: 5 or more times a week   Intimate Partner Violence: High Risk (04/16/2023)    Intimate Partner Violence     SDOH Domestic Violence: No   Housing Stability: Low Risk  (04/16/2023)    Housing Stability     SDOH Housing Situation: I have  housing.     SDOH Housing Worry: No     Review of Systems:   Review of Systems   Constitutional: Negative.    HENT: Negative.     Eyes: Negative.    Respiratory: Negative.     Cardiovascular: Negative.    Gastrointestinal: Negative.    Genitourinary: Negative.    Musculoskeletal: Negative.    Skin: Negative.    Neurological: Negative.    Psychiatric/Behavioral: Negative.        Physical Exam:   Vital Signs: There were no vitals taken for this visit.       Physical Exam  Constitutional:       Appearance: Normal appearance.   HENT:      Head: Normocephalic and atraumatic.   Eyes:      Extraocular Movements: Extraocular movements intact.   Neck:      Vascular: No carotid bruit.   Cardiovascular:      Rate and Rhythm: Normal rate and regular rhythm.      Pulses: Normal pulses.      Heart sounds: Normal heart sounds.   Pulmonary:      Breath sounds: Normal breath sounds.   Abdominal:      General: There is no distension.      Palpations: Abdomen is soft.      Tenderness: There is no abdominal tenderness. There is no guarding or rebound.   Musculoskeletal:      Cervical back: Neck supple.   Skin:     General: Skin is warm and dry.   Neurological:      Mental Status: He is alert and oriented to person, place, and time.   Psychiatric:         Mood and Affect: Mood normal.       Assessment:     No diagnosis found.  Plan:   Keep appointments with specialists.  Exercise daily.  Continue compression stockings.   No orders of the defined types were placed in this encounter.                    No follow-ups on file.    Saquan Furtick A Buchko, DO

## 2023-10-17 ENCOUNTER — Other Ambulatory Visit: Payer: Self-pay

## 2023-10-17 ENCOUNTER — Ambulatory Visit: Payer: 59 | Attending: Family Medicine | Admitting: Family Medicine

## 2023-10-17 VITALS — BP 122/80 | HR 86 | Temp 97.6°F

## 2023-10-17 DIAGNOSIS — E119 Type 2 diabetes mellitus without complications: Secondary | ICD-10-CM | POA: Insufficient documentation

## 2023-10-17 DIAGNOSIS — F900 Attention-deficit hyperactivity disorder, predominantly inattentive type: Secondary | ICD-10-CM | POA: Insufficient documentation

## 2023-10-17 DIAGNOSIS — I10 Essential (primary) hypertension: Secondary | ICD-10-CM | POA: Insufficient documentation

## 2023-10-17 DIAGNOSIS — I82409 Acute embolism and thrombosis of unspecified deep veins of unspecified lower extremity: Secondary | ICD-10-CM | POA: Insufficient documentation

## 2023-10-17 DIAGNOSIS — Z86718 Personal history of other venous thrombosis and embolism: Secondary | ICD-10-CM

## 2023-10-17 DIAGNOSIS — Z955 Presence of coronary angioplasty implant and graft: Secondary | ICD-10-CM

## 2023-10-17 DIAGNOSIS — Z794 Long term (current) use of insulin: Secondary | ICD-10-CM

## 2023-10-17 DIAGNOSIS — I2510 Atherosclerotic heart disease of native coronary artery without angina pectoris: Secondary | ICD-10-CM | POA: Insufficient documentation

## 2023-10-17 DIAGNOSIS — Z86711 Personal history of pulmonary embolism: Secondary | ICD-10-CM | POA: Insufficient documentation

## 2023-10-17 DIAGNOSIS — E785 Hyperlipidemia, unspecified: Secondary | ICD-10-CM | POA: Insufficient documentation

## 2023-10-17 DIAGNOSIS — Z7901 Long term (current) use of anticoagulants: Secondary | ICD-10-CM

## 2023-10-17 DIAGNOSIS — Z7985 Long-term (current) use of injectable non-insulin antidiabetic drugs: Secondary | ICD-10-CM

## 2023-10-17 MED ORDER — LISINOPRIL 10 MG TABLET
10.0000 mg | ORAL_TABLET | Freq: Every day | ORAL | 1 refills | Status: DC
Start: 2023-10-17 — End: 2024-04-28

## 2023-10-17 MED ORDER — ATORVASTATIN 40 MG TABLET
40.0000 mg | ORAL_TABLET | Freq: Every evening | ORAL | 1 refills | Status: DC
Start: 2023-10-17 — End: 2024-07-30

## 2023-10-17 MED ORDER — ELIQUIS 5 MG TABLET
5.0000 mg | ORAL_TABLET | Freq: Two times a day (BID) | ORAL | 1 refills | Status: AC
Start: 2023-10-17 — End: 2024-04-14

## 2023-11-01 ENCOUNTER — Other Ambulatory Visit (HOSPITAL_BASED_OUTPATIENT_CLINIC_OR_DEPARTMENT_OTHER): Payer: Self-pay | Admitting: NURSE PRACTITIONER

## 2023-11-04 ENCOUNTER — Telehealth (HOSPITAL_BASED_OUTPATIENT_CLINIC_OR_DEPARTMENT_OTHER): Payer: Self-pay | Admitting: NURSE PRACTITIONER

## 2023-11-04 MED ORDER — DEXTROAMPHETAMINE-AMPHETAMINE 30 MG TABLET
30.0000 mg | ORAL_TABLET | Freq: Two times a day (BID) | ORAL | 0 refills | Status: DC
Start: 2023-11-04 — End: 2023-12-03

## 2023-11-04 NOTE — Telephone Encounter (Signed)
Adderall 30 mg BID approved for 1 year via [employer]

## 2023-12-03 ENCOUNTER — Other Ambulatory Visit (HOSPITAL_BASED_OUTPATIENT_CLINIC_OR_DEPARTMENT_OTHER): Payer: Self-pay | Admitting: NURSE PRACTITIONER

## 2023-12-03 MED ORDER — DEXTROAMPHETAMINE-AMPHETAMINE 30 MG TABLET
30.0000 mg | ORAL_TABLET | Freq: Two times a day (BID) | ORAL | 0 refills | Status: DC
Start: 2023-12-03 — End: 2023-12-31

## 2023-12-31 ENCOUNTER — Other Ambulatory Visit (HOSPITAL_BASED_OUTPATIENT_CLINIC_OR_DEPARTMENT_OTHER): Payer: Self-pay | Admitting: NURSE PRACTITIONER

## 2023-12-31 MED ORDER — DEXTROAMPHETAMINE-AMPHETAMINE 30 MG TABLET
30.0000 mg | ORAL_TABLET | Freq: Two times a day (BID) | ORAL | 0 refills | Status: DC
Start: 2023-12-31 — End: 2024-01-30

## 2024-01-30 ENCOUNTER — Other Ambulatory Visit (HOSPITAL_BASED_OUTPATIENT_CLINIC_OR_DEPARTMENT_OTHER): Payer: Self-pay | Admitting: NURSE PRACTITIONER

## 2024-01-30 MED ORDER — DEXTROAMPHETAMINE-AMPHETAMINE 30 MG TABLET
30.0000 mg | ORAL_TABLET | Freq: Two times a day (BID) | ORAL | 0 refills | Status: DC
Start: 2024-01-30 — End: 2024-02-27

## 2024-02-06 ENCOUNTER — Other Ambulatory Visit: Payer: Self-pay

## 2024-02-10 ENCOUNTER — Other Ambulatory Visit: Payer: Self-pay

## 2024-02-11 ENCOUNTER — Other Ambulatory Visit: Payer: Self-pay

## 2024-02-11 ENCOUNTER — Encounter (HOSPITAL_BASED_OUTPATIENT_CLINIC_OR_DEPARTMENT_OTHER): Payer: Self-pay | Admitting: NURSE PRACTITIONER

## 2024-02-11 ENCOUNTER — Ambulatory Visit: Payer: Self-pay | Attending: NURSE PRACTITIONER | Admitting: NURSE PRACTITIONER

## 2024-02-11 VITALS — BP 116/79 | HR 94

## 2024-02-11 DIAGNOSIS — F900 Attention-deficit hyperactivity disorder, predominantly inattentive type: Secondary | ICD-10-CM | POA: Insufficient documentation

## 2024-02-11 NOTE — Progress Notes (Signed)
 BEHAVIORAL MEDICINE, [facility]  [address]  [place] [address]       Progress Note    Name: Shawn Zhang MRN:  [id number]   Date: 02/11/2024 Age: 53 y.o.      Chief Complaint:   Chief Complaint              ADHD             History of Present Illness:    Shawn Zhang is a 53 y.o. male with a history of ADHD, predominantly inattentive.  He was last seen by me on 08/07/2023.  He was supposed to follow up in 4 months after that visit but has not returned until today.  At his last visit he stated that he was doing well and his moods has been good.  He stated he was still doing well on his current dose of Adderall.  He denied any problems with the medication other than having trouble getting it at times due to the can shortage of stimulant medications.  He stated they gave him brand name a few times and he noticed it worked better than the generic.  Adderall was continued without change.  He states today that he has been doing well and his moods have been good.  He states the Adderall still seems to be doing pretty well for him.  He said that he is sleeping well and his appetite is good.  Patient denies any suicidal or homicidal ideation, intent or plan.           08/07/2023    11:27 AM 02/11/2024     1:26 PM   Most Recent PHQ-9 Scores   PHQ 9 Total 0 3            04/16/2023    12:59 PM   GAD-7 Questionnaire   Feeling nervous,anxious,on edge 0   Not being able to stop or control worrying 0        Past Medical History:  Past Medical History:   Diagnosis Date    ADHD (attention deficit hyperactivity disorder)     CAD (coronary artery disease)     DM (diabetes mellitus)     DVT (deep venous thrombosis)     History of heart attack     HTN (hypertension)     Hyperlipidemia     Pulmonary emboli     Right knee pain         Past Surgical History:   has a past surgical history that includes Replacement total knee; colonoscopy (10/28/2020); hx lap cholecystectomy;  Umbilical hernia repair; Ankle surgery; and hx coronary stent placement.      Current Medications:  Current Outpatient Medications   Medication Sig    aspirin (ECOTRIN) 81 mg Oral Tablet, Delayed Release (E.C.) Take 1 Tablet (81 mg total) by mouth Every morning    atorvastatin  (LIPITOR) 40 mg Oral Tablet Take 1 Tablet (40 mg total) by mouth Every night for 180 days    BD UF MINI PEN NEEDLE 31 gauge x 3/16" Needle     dextroamphetamine -amphetamine  (ADDERALL) 30 mg Oral Tablet Take 1 Tablet (30 mg total) by mouth Twice daily    ELIQUIS  5 mg Oral Tablet Take 1 Tablet (5 mg total) by mouth Twice daily for 180 days    LANTUS  SOLOSTAR U-100 INSULIN  100 unit/mL (3 mL) Subcutaneous Insulin  Pen Inject 50 Units under the skin Every night    lisinopriL  (PRINIVIL ) 10 mg  Oral Tablet Take 1 Tablet (10 mg total) by mouth Once a day for 180 days    semaglutide  (OZEMPIC ) 0.25 mg or 0.5 mg (2 mg/3 mL) Subcutaneous Pen Injector Inject 0.25 mg under the skin Every 7 days for 30 days, THEN 0.5 mg Every 7 days for 180 days.    TRUE METRIX GLUCOSE TEST STRIP Does not apply Strip USE AS DIRECTED TO CHECK BLOOD GLUCOSE LEVELS 3 TIMES DAILY       Allergies:  Allergies as of 02/11/2024    (No Known Allergies)       Social History:  Social History     Socioeconomic History    Marital status: Legally Separated     Spouse name: Not on file    Number of children: Not on file    Years of education: Not on file    Highest education level: Not on file   Occupational History     Employer: [employer]   Tobacco Use    Smoking status: Former     Current packs/day: 0.50     Average packs/day: 0.5 packs/day for 20.0 years (10.0 ttl pk-yrs)     Types: Cigarettes    Smokeless tobacco: Never   Vaping Use    Vaping status: Never Used   Substance and Sexual Activity    Alcohol use: Yes     Comment: 1-2 times/year    Drug use: Never    Sexual activity: Not on file   Other Topics Concern    Not on file   Social History Narrative    Not on file      Social Determinants of Health     Financial Resource Strain: Low Risk  (04/16/2023)    Financial Resource Strain     SDOH Financial: No   Transportation Needs: Low Risk  (04/16/2023)    Transportation Needs     SDOH Transportation: No   Social Connections: Low Risk  (04/16/2023)    Social Connections     SDOH Social Isolation: 5 or more times a week   Intimate Partner Violence: High Risk (04/16/2023)    Intimate Partner Violence     SDOH Domestic Violence: No   Housing Stability: Low Risk  (04/16/2023)    Housing Stability     SDOH Housing Situation: I have housing.     SDOH Housing Worry: No        Family History:  Family Medical History:       Problem Relation (Age of Onset)    Diabetes Mother        Review of Systems:   Review of Systems as described in the HPI.      Vitals:    02/11/24 1328   BP: 116/79   Pulse: 94   SpO2: 99%       Mental Status Exam:  Oriented to person, place and time.  Well groomed and with good hygiene.  Good eye contact.  Behavior was pleasant and cooperative.  Speech is clear with regular rate and volume.  Mood reported to be good.  Affect was appropriate to situation.  Thought process was linear and logical.  Cognition without impairment.  Insight and judgment without gross impairment.  Perceptual without visual or auditory hallucination or delusions.  Denies suicidal or homicidal ideation.  No intention or plan.         PHQ Questionnaire  Little interest or pleasure in doing things.: Several Days  Feeling down, depressed, or hopeless: Not at all  PHQ 2 Total: 1  Trouble falling or staying asleep, or sleeping too much.: Not at all  Feeling tired or having little energy: Not at all  Poor appetite or overeating: Not at all  Feeling bad about yourself/ that you are a failure in the past 2 weeks?: Not at all  Trouble concentrating on things in the past 2 weeks?: More than half the days  Moving/Speaking slowly or being fidgety or restless  in the past 2 weeks?: Not at all  Thoughts that you would  be better off DEAD, or of hurting yourself in some way.: Not at all  If you checked off any problems, how difficult have these problems made it for you to do your work, take care of things at home, or get along with other people?: Not difficult at all  PHQ 9 Total: 3      Assessment:  ENCOUNTER DIAGNOSES     ICD-10-CM   1. ADHD (attention deficit hyperactivity disorder), inattentive type  F90.0       Plan:    Continue Adderall 30 mg twice per day--BOP reviewed     Advised to call our office with any questions or concerns prior to next appointment.    Provided supportive therapy and psychoeducation.     Patient is free of any thoughts, intent, plan, means or gestures to harm self or others.  Patient also agrees to seek help by calling 911 or going to the ER if feeling unsafe or if suicidal thoughts/plan occur.  Patient is aware of risks and need for follow up.     Follow up in 4 months or sooner if needed    Holmes Lusher, APRN,FNP-BC

## 2024-02-11 NOTE — Nursing Note (Signed)
 02/11/24 1326   PHQ 9 (follow up)   Little interest or pleasure in doing things. 1   Feeling down, depressed, or hopeless 0   Trouble falling or staying asleep, or sleeping too much. 0   Feeling tired or having little energy 0   Poor appetite or overeating 0   Feeling bad about yourself/ that you are a failure in the past 2 weeks? 0   Trouble concentrating on things in the past 2 weeks? 2   Moving/Speaking slowly or being fidgety or restless  in the past 2 weeks? 0   Thoughts that you would be better off DEAD, or of hurting yourself in some way. 0   If you checked off any problems, how difficult have these problems made it for you to do your work, take care of things at home, or get along with other people? Not difficult at all   PHQ 9 Total 3

## 2024-02-27 ENCOUNTER — Encounter (HOSPITAL_BASED_OUTPATIENT_CLINIC_OR_DEPARTMENT_OTHER): Payer: Self-pay | Admitting: FAMILY PRACTICE

## 2024-02-27 ENCOUNTER — Other Ambulatory Visit: Payer: Self-pay

## 2024-02-27 ENCOUNTER — Ambulatory Visit: Payer: Self-pay | Attending: FAMILY PRACTICE | Admitting: FAMILY PRACTICE

## 2024-02-27 ENCOUNTER — Other Ambulatory Visit (HOSPITAL_BASED_OUTPATIENT_CLINIC_OR_DEPARTMENT_OTHER): Payer: Self-pay | Admitting: NURSE PRACTITIONER

## 2024-02-27 VITALS — BP 128/75 | HR 87 | Temp 97.3°F | Ht 73.0 in | Wt 236.6 lb

## 2024-02-27 DIAGNOSIS — Z87891 Personal history of nicotine dependence: Secondary | ICD-10-CM

## 2024-02-27 DIAGNOSIS — Z7985 Long-term (current) use of injectable non-insulin antidiabetic drugs: Secondary | ICD-10-CM

## 2024-02-27 DIAGNOSIS — I2510 Atherosclerotic heart disease of native coronary artery without angina pectoris: Secondary | ICD-10-CM | POA: Insufficient documentation

## 2024-02-27 DIAGNOSIS — E119 Type 2 diabetes mellitus without complications: Secondary | ICD-10-CM | POA: Insufficient documentation

## 2024-02-27 DIAGNOSIS — I2699 Other pulmonary embolism without acute cor pulmonale: Secondary | ICD-10-CM | POA: Insufficient documentation

## 2024-02-27 DIAGNOSIS — I10 Essential (primary) hypertension: Secondary | ICD-10-CM | POA: Insufficient documentation

## 2024-02-27 DIAGNOSIS — E785 Hyperlipidemia, unspecified: Secondary | ICD-10-CM | POA: Insufficient documentation

## 2024-02-27 DIAGNOSIS — F900 Attention-deficit hyperactivity disorder, predominantly inattentive type: Secondary | ICD-10-CM | POA: Insufficient documentation

## 2024-02-27 MED ORDER — DEXTROAMPHETAMINE-AMPHETAMINE 30 MG TABLET
30.0000 mg | ORAL_TABLET | Freq: Two times a day (BID) | ORAL | 0 refills | Status: DC
Start: 2024-02-27 — End: 2024-03-30

## 2024-02-27 NOTE — Progress Notes (Signed)
 FAMILY MEDICINE, [facility]  [address]  [place] [address]  Operated by [facility]  History and Physical    Name: Shawn Zhang MRN:  [id number]   Date: 02/27/2024 DOB:  January 16, 1971 (53 y.o.)            Discussion:    Assessment/Plan:  [name] was seen today for new patient.    Diagnoses and all orders for this visit:    CAD (coronary artery disease)    HTN (hypertension)    Hyperlipidemia    Pulmonary embolism    DM (diabetes mellitus)    ADHD (attention deficit hyperactivity disorder), inattentive type       There are no discontinued medications.      Reason for Visit: New Patient (New patient, establish care)    History of Present Illness  Shawn Zhang is a 53 y.o. male who is being seen today for   Chief Complaint   Patient presents with    New Patient     New patient, establish care        (I25.10) CAD (coronary artery disease)  (primary encounter diagnosis)  (I10) HTN (hypertension)  (E78.5) Hyperlipidemia  Plan: Patient reports no dizziness upon standing,. no headaches, no nose bleeds, no chest pain, no shortness of breath.  Follows with [facility]    (I26.99) Pulmonary embolism  Plan: on Eliquis     (E11.9) DM (diabetes mellitus)  Plan: follows with Dr. Naida Austria    (F90.0) ADHD (attention deficit hyperactivity disorder), inattentive type  Plan: follows with [name]       Past Medical History:   Diagnosis Date    ADHD (attention deficit hyperactivity disorder)     CAD (coronary artery disease)     DM (diabetes mellitus)     DVT (deep venous thrombosis)     History of heart attack     HTN (hypertension)     Hyperlipidemia     Pulmonary emboli     Right knee pain       Past Surgical History:   Procedure Laterality Date    ANKLE SURGERY      COLONOSCOPY  10/28/2020    HX CORONARY STENT PLACEMENT      HX LAP CHOLECYSTECTOMY      REPLACEMENT TOTAL KNEE      UMBILICAL HERNIA REPAIR           Family Medical History:       Problem  Relation (Age of Onset)    Diabetes Mother            Social History     Tobacco Use    Smoking status: Former     Current packs/day: 0.50     Average packs/day: 0.5 packs/day for 20.0 years (10.0 ttl pk-yrs)     Types: Cigarettes    Smokeless tobacco: Never   Vaping Use    Vaping status: Never Used   Substance Use Topics    Alcohol use: Not Currently     Comment: 1-2 times/year    Drug use: Never       Review of Systems  Review of Systems   Constitutional:  Negative for chills and fever.   Respiratory:  Negative for shortness of breath.    Cardiovascular:  Negative for chest pain.        Medication:  aspirin (ECOTRIN) 81 mg Oral Tablet, Delayed Release (E.C.), Take 1 Tablet (81 mg total) by mouth Every morning  atorvastatin  (LIPITOR) 40 mg Oral Tablet, Take 1 Tablet (40 mg total) by mouth Every night for 180 days  BD UF MINI PEN NEEDLE 31 gauge x 3/16" Needle,   ELIQUIS  5 mg Oral Tablet, Take 1 Tablet (5 mg total) by mouth Twice daily for 180 days  LANTUS  SOLOSTAR U-100 INSULIN  100 unit/mL (3 mL) Subcutaneous Insulin  Pen, Inject 50 Units under the skin Every night  lisinopriL  (PRINIVIL ) 10 mg Oral Tablet, Take 1 Tablet (10 mg total) by mouth Once a day for 180 days  semaglutide  (OZEMPIC ) 0.25 mg or 0.5 mg (2 mg/3 mL) Subcutaneous Pen Injector, Inject 0.25 mg under the skin Every 7 days for 30 days, THEN 0.5 mg Every 7 days for 180 days.  TRUE METRIX GLUCOSE TEST STRIP Does not apply Strip, USE AS DIRECTED TO CHECK BLOOD GLUCOSE LEVELS 3 TIMES DAILY  dextroamphetamine -amphetamine  (ADDERALL) 30 mg Oral Tablet, Take 1 Tablet (30 mg total) by mouth Twice daily    No facility-administered medications prior to visit.    Allergies:No Known Allergies    Physical Exam:  Vitals:    02/27/24 1146   BP: 128/75   Pulse: 87   Temp: 36.3 C (97.3 F)   SpO2: 98%   Weight: 107 kg (236 lb 9.6 oz)   Height: 1.854 m (6\' 1" )   BMI: 31.22      Physical Exam  Constitutional:       General: He is not in acute distress.     Appearance:  Normal appearance.   Cardiovascular:      Rate and Rhythm: Normal rate and regular rhythm.      Heart sounds: No murmur heard.  Pulmonary:      Breath sounds: Normal breath sounds. No wheezing, rhonchi or rales.   Neurological:      Mental Status: He is oriented to person, place, and time.              Reviewed family history, medication history, social history, and allergies with the patient today.   Follow up: Return in about 8 months (around 10/29/2024).  Seek medical attention for new or worsening symptoms.  Medication list was confirmed with the patient and changes reflected in the medical record.  Discussed potential side effects as well as requirements of monitoring for any medications that were changed or added today.    Pamla Boettcher, MD          This note was partially created using MModal Fluency Direct system (voice recognition software) and is inherently subject to errors including those of syntax and "sound-alike" substitutions which may escape proofreading.  In such instances, original meaning may be extrapolated by contextual derivation.

## 2024-03-16 ENCOUNTER — Telehealth (HOSPITAL_BASED_OUTPATIENT_CLINIC_OR_DEPARTMENT_OTHER): Payer: Self-pay | Admitting: NURSE PRACTITIONER

## 2024-03-30 ENCOUNTER — Ambulatory Visit
Payer: Self-pay | Attending: INTERNAL MEDICINE-ENDOCRINOLOGY-DIABETES AND METABOLISM | Admitting: INTERNAL MEDICINE-ENDOCRINOLOGY-DIABETES AND METABOLISM

## 2024-03-30 ENCOUNTER — Other Ambulatory Visit (HOSPITAL_BASED_OUTPATIENT_CLINIC_OR_DEPARTMENT_OTHER): Payer: Self-pay | Admitting: NURSE PRACTITIONER

## 2024-03-30 ENCOUNTER — Other Ambulatory Visit: Payer: Self-pay

## 2024-03-30 ENCOUNTER — Ambulatory Visit (HOSPITAL_BASED_OUTPATIENT_CLINIC_OR_DEPARTMENT_OTHER): Payer: Self-pay | Admitting: FAMILY PRACTICE

## 2024-03-30 VITALS — BP 100/60 | HR 83 | Temp 98.1°F | Resp 16 | Ht 73.0 in | Wt 236.0 lb

## 2024-03-30 DIAGNOSIS — E119 Type 2 diabetes mellitus without complications: Secondary | ICD-10-CM | POA: Insufficient documentation

## 2024-03-30 DIAGNOSIS — E782 Mixed hyperlipidemia: Secondary | ICD-10-CM | POA: Insufficient documentation

## 2024-03-30 DIAGNOSIS — I10 Essential (primary) hypertension: Secondary | ICD-10-CM | POA: Insufficient documentation

## 2024-03-30 MED ORDER — LANTUS SOLOSTAR U-100 INSULIN 100 UNIT/ML (3 ML) SUBCUTANEOUS PEN
50.0000 [IU] | PEN_INJECTOR | Freq: Every evening | SUBCUTANEOUS | 5 refills | Status: AC
Start: 2024-03-30 — End: ?

## 2024-03-30 NOTE — Progress Notes (Signed)
 ENDOCRINOLOGY, [facility]  [address]  [place] [address]  Operated by [facility]     Name: Shawn Zhang MRN:  [id number]   Date of Birth: June 10, 1971 Age: 53 y.o.   Date: 03/30/2024         Reason for visit: Diabetes (Last office visit 09/27/23)      Objective:  Shawn Zhang is a 53 y.o. male presenting with type 2 diabetes.  He had a  flat tire and left his meter in his car.  He is taking Ozempic  0.5 mg a week and Lantus  50 units daily.  Glucoses up to 160's and 170's to a low of 90.  Hba1c was 8% in December.  He is taking Lipitor 40 mg a day for his cholesterol.  He has lost 20 pounds since last appt.      Past Medical History:  Past Medical History:   Diagnosis Date    ADHD (attention deficit hyperactivity disorder)     CAD (coronary artery disease)     DM (diabetes mellitus)     DVT (deep venous thrombosis)     History of heart attack     HTN (hypertension)     Hyperlipidemia     Pulmonary emboli     Right knee pain           Allergies[1]  Current Outpatient Medications   Medication Sig    aspirin (ECOTRIN) 81 mg Oral Tablet, Delayed Release (E.C.) Take 1 Tablet (81 mg total) by mouth Every morning    atorvastatin  (LIPITOR) 40 mg Oral Tablet Take 1 Tablet (40 mg total) by mouth Every night for 180 days    BD UF MINI PEN NEEDLE 31 gauge x 3/16 Needle     dextroamphetamine -amphetamine  (ADDERALL) 30 mg Oral Tablet Take 1 Tablet (30 mg total) by mouth Twice daily    ELIQUIS  5 mg Oral Tablet Take 1 Tablet (5 mg total) by mouth Twice daily for 180 days    LANTUS  SOLOSTAR U-100 INSULIN  100 unit/mL (3 mL) Subcutaneous Insulin  Pen Inject 50 Units under the skin Every night    lisinopriL  (PRINIVIL ) 10 mg Oral Tablet Take 1 Tablet (10 mg total) by mouth Once a day for 180 days    semaglutide  (OZEMPIC ) 0.25 mg or 0.5 mg (2 mg/3 mL) Subcutaneous Pen Injector Inject 0.25 mg under the skin Every 7 days for 30 days, THEN 0.5 mg Every 7 days for 180 days.    TRUE METRIX  GLUCOSE TEST STRIP Does not apply Strip USE AS DIRECTED TO CHECK BLOOD GLUCOSE LEVELS 3 TIMES DAILY     No results found for this or any previous visit.        Review of Systems:  The pertinent ROS as addressed in the HPI.    Physical Exam:  Vitals:   Vitals:    03/30/24 0905   BP: 100/60   Pulse: 83   Resp: 16   Temp: 36.7 C (98.1 F)   TempSrc: Temporal   SpO2: 98%   Weight: 107 kg (236 lb)   Height: 1.854 m (6' 1)   BMI: 31.14     Physical Exam  HENT:      Head: Normocephalic and atraumatic.   Eyes:      Extraocular Movements: Extraocular movements intact.      Conjunctiva/sclera: Conjunctivae normal.   Neck:      Thyroid: No thyromegaly.   Cardiovascular:      Rate and Rhythm:  Normal rate and regular rhythm.      Heart sounds: No murmur heard.  Pulmonary:      Effort: Pulmonary effort is normal. No respiratory distress.      Breath sounds: Normal breath sounds.   Musculoskeletal:      Right lower leg: No edema.      Left lower leg: No edema.      Comments: He is wearing compression hose    Lymphadenopathy:      Cervical: No cervical adenopathy.   Neurological:      Mental Status: He is alert and oriented to person, place, and time.   Psychiatric:         Mood and Affect: Mood normal.          Assessment and Plan:    ICD-10-CM    1. Type II diabetes mellitus  E11.9       2. Mixed hyperlipidemia  E78.2       3. Essential hypertension  I10       Labs ordered and we will call him with results.      Follow Up:  Return in about 6 months (around 09/29/2024) for In Person Visit.     Lue Sage, MD          [1] No Known Allergies

## 2024-04-01 ENCOUNTER — Other Ambulatory Visit (HOSPITAL_BASED_OUTPATIENT_CLINIC_OR_DEPARTMENT_OTHER): Payer: Self-pay | Admitting: INTERNAL MEDICINE-ENDOCRINOLOGY-DIABETES AND METABOLISM

## 2024-04-01 DIAGNOSIS — E119 Type 2 diabetes mellitus without complications: Secondary | ICD-10-CM

## 2024-04-01 DIAGNOSIS — E782 Mixed hyperlipidemia: Secondary | ICD-10-CM

## 2024-04-01 MED ORDER — DEXTROAMPHETAMINE-AMPHETAMINE 30 MG TABLET
30.0000 mg | ORAL_TABLET | Freq: Two times a day (BID) | ORAL | 0 refills | Status: DC
Start: 2024-04-01 — End: 2024-04-28

## 2024-04-05 ENCOUNTER — Ambulatory Visit (HOSPITAL_BASED_OUTPATIENT_CLINIC_OR_DEPARTMENT_OTHER): Payer: Self-pay | Admitting: INTERNAL MEDICINE-ENDOCRINOLOGY-DIABETES AND METABOLISM

## 2024-04-14 ENCOUNTER — Ambulatory Visit (HOSPITAL_BASED_OUTPATIENT_CLINIC_OR_DEPARTMENT_OTHER): Payer: Self-pay | Admitting: Family Medicine

## 2024-04-16 ENCOUNTER — Other Ambulatory Visit (HOSPITAL_BASED_OUTPATIENT_CLINIC_OR_DEPARTMENT_OTHER): Payer: Self-pay | Admitting: Family Medicine

## 2024-04-28 ENCOUNTER — Other Ambulatory Visit (HOSPITAL_BASED_OUTPATIENT_CLINIC_OR_DEPARTMENT_OTHER): Payer: Self-pay | Admitting: NURSE PRACTITIONER

## 2024-04-28 ENCOUNTER — Other Ambulatory Visit (HOSPITAL_BASED_OUTPATIENT_CLINIC_OR_DEPARTMENT_OTHER): Payer: Self-pay | Admitting: Family Medicine

## 2024-04-28 ENCOUNTER — Other Ambulatory Visit (HOSPITAL_BASED_OUTPATIENT_CLINIC_OR_DEPARTMENT_OTHER): Payer: Self-pay | Admitting: FAMILY PRACTICE

## 2024-04-28 MED ORDER — LISINOPRIL 10 MG TABLET
10.0000 mg | ORAL_TABLET | Freq: Every day | ORAL | 1 refills | Status: AC
Start: 2024-04-28 — End: ?

## 2024-04-28 MED ORDER — DEXTROAMPHETAMINE-AMPHETAMINE 30 MG TABLET
30.0000 mg | ORAL_TABLET | Freq: Two times a day (BID) | ORAL | 0 refills | Status: DC
Start: 2024-04-28 — End: 2024-05-29

## 2024-05-29 ENCOUNTER — Other Ambulatory Visit (HOSPITAL_BASED_OUTPATIENT_CLINIC_OR_DEPARTMENT_OTHER): Payer: Self-pay | Admitting: NURSE PRACTITIONER

## 2024-05-29 MED ORDER — DEXTROAMPHETAMINE-AMPHETAMINE 30 MG TABLET
30.0000 mg | ORAL_TABLET | Freq: Two times a day (BID) | ORAL | 0 refills | Status: DC
Start: 2024-05-29 — End: 2024-06-29

## 2024-06-04 ENCOUNTER — Other Ambulatory Visit: Payer: Self-pay

## 2024-06-05 ENCOUNTER — Ambulatory Visit: Payer: Self-pay | Attending: NURSE PRACTITIONER | Admitting: NURSE PRACTITIONER

## 2024-06-05 ENCOUNTER — Encounter (HOSPITAL_BASED_OUTPATIENT_CLINIC_OR_DEPARTMENT_OTHER): Payer: Self-pay | Admitting: NURSE PRACTITIONER

## 2024-06-05 DIAGNOSIS — F900 Attention-deficit hyperactivity disorder, predominantly inattentive type: Secondary | ICD-10-CM | POA: Insufficient documentation

## 2024-06-05 NOTE — Nursing Note (Signed)
 06/05/24 0857   PHQ 9 (follow up)   Little interest or pleasure in doing things. 0   Feeling down, depressed, or hopeless 0   Trouble falling or staying asleep, or sleeping too much. 0   Feeling tired or having little energy 0   Poor appetite or overeating 0   Feeling bad about yourself/ that you are a failure in the past 2 weeks? 0   Trouble concentrating on things in the past 2 weeks? 0   Moving/Speaking slowly or being fidgety or restless  in the past 2 weeks? 0   Thoughts that you would be better off DEAD, or of hurting yourself in some way. 0   If you checked off any problems, how difficult have these problems made it for you to do your work, take care of things at home, or get along with other people? Not difficult at all   PHQ 9 Total 0

## 2024-06-05 NOTE — Progress Notes (Signed)
 BEHAVIORAL MEDICINE, [facility]  [address]  [place] [address]       Progress Note    Name: Shawn Zhang MRN:  [id number]   Date: 06/05/2024 Age: 53 y.o.      Chief Complaint:   Chief Complaint              ADHD             History of Present Illness:    Demitri Kucinski is a 53 y.o. male with a history of ADHD.  He was last seen by me on 02/11/2024.  At that time he stated he had been doing well and his moods has been good.  He stated the Adderall still seemed to be doing pretty well for him.  He stated that he was sleeping well.  Adderall was continued without change.  He stated today that everything has been good and his moods has been good.  He stated that he still feels like the Adderall was working well for him.  He denies any problems with it.  He stated his appetite is good and he is sleeping well.  Patient denies any suicidal or homicidal ideation, intent or plan.           08/07/2023    11:27 AM 02/11/2024     1:26 PM 06/05/2024     8:57 AM   Most Recent PHQ-9 Scores   PHQ 9 Total 0 3 0            04/16/2023    12:59 PM   GAD-7 Questionnaire   Feeling nervous,anxious,on edge 0   Not being able to stop or control worrying 0        Past Medical History:  Past Medical History:   Diagnosis Date    ADHD (attention deficit hyperactivity disorder)     CAD (coronary artery disease)     DM (diabetes mellitus)     DVT (deep venous thrombosis)     History of heart attack     HTN (hypertension)     Hyperlipidemia     Pulmonary emboli     Right knee pain        Past Surgical History:   has a past surgical history that includes Replacement total knee; colonoscopy (10/28/2020); hx lap cholecystectomy; Umbilical hernia repair; Ankle surgery; and hx coronary stent placement.      Current Medications:  Current Outpatient Medications   Medication Sig    aspirin (ECOTRIN) 81 mg Oral Tablet, Delayed Release (E.C.) Take 1 Tablet (81 mg total) by mouth Every morning     atorvastatin  (LIPITOR) 40 mg Oral Tablet Take 1 Tablet (40 mg total) by mouth Every night for 180 days    BD UF MINI PEN NEEDLE 31 gauge x 3/16 Needle     dextroamphetamine -amphetamine  (ADDERALL) 30 mg Oral Tablet Take 1 Tablet (30 mg total) by mouth Twice daily    LANTUS  SOLOSTAR U-100 INSULIN  100 unit/mL (3 mL) Subcutaneous Insulin  Pen Inject 50 Units under the skin Every night    lisinopriL  (PRINIVIL ) 10 mg Oral Tablet Take 1 Tablet (10 mg total) by mouth Daily    semaglutide  (OZEMPIC ) 0.25 mg or 0.5 mg (2 mg/3 mL) Subcutaneous Pen Injector Inject 0.25 mg under the skin Every 7 days for 30 days, THEN 0.5 mg Every 7 days for 180 days.    TRUE METRIX GLUCOSE TEST STRIP Does not apply Strip USE AS DIRECTED TO CHECK BLOOD  GLUCOSE LEVELS 3 TIMES DAILY       Allergies:  Allergies as of 06/05/2024    (No Known Allergies)       Social History:  Social History     Socioeconomic History    Marital status: Legally Separated     Spouse name: Not on file    Number of children: Not on file    Years of education: Not on file    Highest education level: Not on file   Occupational History     Employer: [employer]   Tobacco Use    Smoking status: Former     Current packs/day: 0.50     Average packs/day: 0.5 packs/day for 20.0 years (10.0 ttl pk-yrs)     Types: Cigarettes    Smokeless tobacco: Never   Vaping Use    Vaping status: Never Used   Substance and Sexual Activity    Alcohol use: Not Currently     Comment: 1-2 times/year    Drug use: Never    Sexual activity: Not on file   Other Topics Concern    Not on file   Social History Narrative    Not on file     Social Determinants of Health     Financial Resource Strain: Low Risk  (04/16/2023)    Financial Resource Strain     SDOH Financial: No   Transportation Needs: Low Risk  (04/16/2023)    Transportation Needs     SDOH Transportation: No   Social Connections: Low Risk  (04/16/2023)    Social Connections     SDOH Social Isolation: 5 or more times a week   Intimate Partner  Violence: High Risk (04/16/2023)    Intimate Partner Violence     SDOH Domestic Violence: No   Housing Stability: Low Risk  (04/16/2023)    Housing Stability     SDOH Housing Situation: I have housing.     SDOH Housing Worry: No        Family History:  Family Medical History:       Problem Relation (Age of Onset)    Diabetes Mother        Review of Systems:   Review of Systems as described in the HPI.    There were no vitals filed for this visit.    Mental Status Exam:  Oriented to person, place and time.  Well groomed and with good hygiene.  Good eye contact.  Behavior was pleasant and cooperative.  Speech is clear with regular rate and volume.  Mood reported to be good.  Affect was appropriate to situation.  Thought process was linear and logical.  Cognition without impairment.  Insight and judgment without gross impairment.  Perceptual without visual or auditory hallucination or delusions.  Denies suicidal or homicidal ideation.  No intention or plan.         PHQ Questionnaire  Little interest or pleasure in doing things.: Not at all  Feeling down, depressed, or hopeless: Not at all  PHQ 2 Total: 0  Trouble falling or staying asleep, or sleeping too much.: Not at all  Feeling tired or having little energy: Not at all  Poor appetite or overeating: Not at all  Feeling bad about yourself/ that you are a failure in the past 2 weeks?: Not at all  Trouble concentrating on things in the past 2 weeks?: Not at all  Moving/Speaking slowly or being fidgety or restless  in the past 2 weeks?: Not at all  Thoughts that you would be better off DEAD, or of hurting yourself in some way.: Not at all  If you checked off any problems, how difficult have these problems made it for you to do your work, take care of things at home, or get along with other people?: Not difficult at all  PHQ 9 Total: 0      Assessment:  ENCOUNTER DIAGNOSES     ICD-10-CM   1. ADHD (attention deficit hyperactivity disorder), inattentive type  F90.0       Plan:     Continue Adderall 30 mg twice per day--BOP reviewed    Advised to call our office with any questions or concerns prior to next appointment.    Provided supportive therapy and psychoeducation.     Patient is free of any thoughts, intent, plan, means or gestures to harm self or others.  Patient also agrees to seek help by calling 911 or going to the ER if feeling unsafe or if suicidal thoughts/plan occur.  Patient is aware of risks and need for follow up.     Follow up in 6 months or sooner if needed    Suzen JONETTA Ellen, APRN,FNP-BC

## 2024-06-12 ENCOUNTER — Ambulatory Visit (HOSPITAL_BASED_OUTPATIENT_CLINIC_OR_DEPARTMENT_OTHER): Payer: Self-pay | Admitting: NURSE PRACTITIONER

## 2024-06-29 ENCOUNTER — Other Ambulatory Visit (HOSPITAL_BASED_OUTPATIENT_CLINIC_OR_DEPARTMENT_OTHER): Payer: Self-pay | Admitting: NURSE PRACTITIONER

## 2024-06-29 MED ORDER — DEXTROAMPHETAMINE-AMPHETAMINE 30 MG TABLET
30.0000 mg | ORAL_TABLET | Freq: Two times a day (BID) | ORAL | 0 refills | Status: DC
Start: 2024-06-29 — End: 2024-07-29

## 2024-06-29 NOTE — Telephone Encounter (Signed)
 Pt calling requesting refill but wants it sent to [facility] in [place] [address], no other pharmacy has it.

## 2024-07-29 ENCOUNTER — Other Ambulatory Visit (HOSPITAL_BASED_OUTPATIENT_CLINIC_OR_DEPARTMENT_OTHER): Payer: Self-pay | Admitting: PHYSICIAN ASSISTANT

## 2024-07-29 ENCOUNTER — Other Ambulatory Visit (HOSPITAL_BASED_OUTPATIENT_CLINIC_OR_DEPARTMENT_OTHER): Payer: Self-pay | Admitting: NURSE PRACTITIONER

## 2024-07-29 DIAGNOSIS — F900 Attention-deficit hyperactivity disorder, predominantly inattentive type: Secondary | ICD-10-CM

## 2024-07-29 MED ORDER — DEXTROAMPHETAMINE-AMPHETAMINE 30 MG TABLET
30.0000 mg | ORAL_TABLET | Freq: Two times a day (BID) | ORAL | 0 refills | Status: DC
Start: 2024-07-29 — End: 2024-07-29

## 2024-07-29 MED ORDER — DEXTROAMPHETAMINE-AMPHETAMINE 30 MG TABLET
30.0000 mg | ORAL_TABLET | Freq: Two times a day (BID) | ORAL | 0 refills | Status: DC
Start: 2024-07-29 — End: 2024-08-28

## 2024-07-29 NOTE — Telephone Encounter (Signed)
 PDMP reviewed. No discrepancies noted. Rx e-scribed.      Medication refilled for Stephane Ellen, NP, patient, as she is out of the office.

## 2024-07-30 ENCOUNTER — Other Ambulatory Visit (HOSPITAL_BASED_OUTPATIENT_CLINIC_OR_DEPARTMENT_OTHER): Payer: Self-pay

## 2024-07-31 MED ORDER — ATORVASTATIN 40 MG TABLET
40.0000 mg | ORAL_TABLET | Freq: Every evening | ORAL | 1 refills | Status: AC
Start: 2024-07-31 — End: 2025-01-27

## 2024-08-28 ENCOUNTER — Other Ambulatory Visit (HOSPITAL_BASED_OUTPATIENT_CLINIC_OR_DEPARTMENT_OTHER): Payer: Self-pay | Admitting: NURSE PRACTITIONER

## 2024-08-28 DIAGNOSIS — F900 Attention-deficit hyperactivity disorder, predominantly inattentive type: Secondary | ICD-10-CM

## 2024-08-28 MED ORDER — DEXTROAMPHETAMINE-AMPHETAMINE 30 MG TABLET: 30.0000 mg | ORAL_TABLET | Freq: Two times a day (BID) | ORAL | 0 refills | Status: DC

## 2024-09-28 ENCOUNTER — Other Ambulatory Visit (HOSPITAL_BASED_OUTPATIENT_CLINIC_OR_DEPARTMENT_OTHER): Payer: Self-pay | Admitting: NURSE PRACTITIONER

## 2024-09-28 DIAGNOSIS — F900 Attention-deficit hyperactivity disorder, predominantly inattentive type: Secondary | ICD-10-CM

## 2024-09-29 MED ORDER — DEXTROAMPHETAMINE-AMPHETAMINE 30 MG TABLET: 30.0000 mg | ORAL_TABLET | Freq: Two times a day (BID) | ORAL | 0 refills | Status: DC

## 2024-10-01 ENCOUNTER — Ambulatory Visit (HOSPITAL_BASED_OUTPATIENT_CLINIC_OR_DEPARTMENT_OTHER): Payer: Self-pay | Admitting: INTERNAL MEDICINE-ENDOCRINOLOGY-DIABETES AND METABOLISM

## 2024-10-28 ENCOUNTER — Other Ambulatory Visit (HOSPITAL_BASED_OUTPATIENT_CLINIC_OR_DEPARTMENT_OTHER): Payer: Self-pay | Admitting: NURSE PRACTITIONER

## 2024-10-28 DIAGNOSIS — F900 Attention-deficit hyperactivity disorder, predominantly inattentive type: Secondary | ICD-10-CM

## 2024-10-28 MED ORDER — DEXTROAMPHETAMINE-AMPHETAMINE 30 MG TABLET: 30.0000 mg | ORAL_TABLET | Freq: Two times a day (BID) | ORAL | 0 refills | Status: AC

## 2024-10-30 ENCOUNTER — Encounter (HOSPITAL_BASED_OUTPATIENT_CLINIC_OR_DEPARTMENT_OTHER): Payer: Self-pay | Admitting: NURSE PRACTITIONER

## 2024-10-30 ENCOUNTER — Ambulatory Visit (HOSPITAL_BASED_OUTPATIENT_CLINIC_OR_DEPARTMENT_OTHER): Payer: Self-pay | Admitting: FAMILY PRACTICE

## 2024-10-30 NOTE — Nursing Note (Signed)
 Spoke with  [facility] for Prior Auth.  Adderall 30 mg x2 daily approved for one year .SABRA

## 2024-11-03 ENCOUNTER — Encounter (HOSPITAL_BASED_OUTPATIENT_CLINIC_OR_DEPARTMENT_OTHER): Payer: Self-pay | Admitting: FAMILY PRACTICE

## 2024-11-03 ENCOUNTER — Ambulatory Visit: Attending: FAMILY PRACTICE | Admitting: FAMILY PRACTICE

## 2024-11-03 ENCOUNTER — Other Ambulatory Visit: Payer: Self-pay

## 2024-11-03 VITALS — BP 130/80 | HR 80 | Temp 98.2°F | Ht 73.0 in | Wt 226.2 lb

## 2024-11-03 DIAGNOSIS — H6501 Acute serous otitis media, right ear: Secondary | ICD-10-CM | POA: Insufficient documentation

## 2024-11-03 DIAGNOSIS — Z86711 Personal history of pulmonary embolism: Secondary | ICD-10-CM | POA: Insufficient documentation

## 2024-11-03 NOTE — Progress Notes (Signed)
 FAMILY MEDICINE, [facility]  [address]  [place] [address]  Operated by [facility]  History and Physical    Name: Shawn Zhang MRN:  [id number]   Date: 11/03/2024 DOB:  August 29, 1971 (54 y.o.)            Discussion:    Assessment/Plan:  Shawn Zhang was seen today for follow up and ear problem(s).    Diagnoses and all orders for this visit:    Right acute serous otitis media, recurrence not specified    Hx pulmonary embolism       There are no discontinued medications.      Reason for Visit: Follow Up and Ear Problem(s) (Pt states it feels like his ear is stopped up. )    History of Present Illness  Shawn Zhang is a 54 y.o. male who is being seen today for   Chief Complaint   Patient presents with    Follow Up    Ear Problem(s)     Pt states it feels like his ear is stopped up.         (H65.01) Right acute serous otitis media, recurrence not specified  (primary encounter diagnosis)  Plan: has felt like his right ear is bothering him    (Z86.711) Hx pulmonary embolism  Plan: still on Eliquis      Still following with Dr. Viki, [facility] and [facility]    Past Medical History:   Diagnosis Date    ADHD (attention deficit hyperactivity disorder)     CAD (coronary artery disease)     DM (diabetes mellitus)     DVT (deep venous thrombosis)     History of heart attack     HTN (hypertension)     Hyperlipidemia     Pulmonary emboli     Right knee pain       Past Surgical History:   Procedure Laterality Date    ANKLE SURGERY      COLONOSCOPY  10/28/2020    HX CORONARY STENT PLACEMENT      HX LAP CHOLECYSTECTOMY      REPLACEMENT TOTAL KNEE      UMBILICAL HERNIA REPAIR           Family Medical History:       Problem Relation (Age of Onset)    Diabetes Mother            Social History[1]    Review of Systems  Review of Systems   Constitutional:  Negative for chills and fever.   Respiratory:  Negative for shortness of breath.    Cardiovascular:  Negative for  chest pain.        Medication:  aspirin (ECOTRIN) 81 mg Oral Tablet, Delayed Release (E.C.), Take 1 Tablet (81 mg total) by mouth Every morning  atorvastatin  (LIPITOR) 40 mg Oral Tablet, Take 1 Tablet (40 mg total) by mouth Every night  BD UF MINI PEN NEEDLE 31 gauge x 3/16 Needle,   dextroamphetamine -amphetamine  (ADDERALL) 30 mg Oral Tablet, Take 1 Tablet (30 mg total) by mouth Twice daily  LANTUS  SOLOSTAR U-100 INSULIN  100 unit/mL (3 mL) Subcutaneous Insulin  Pen, Inject 50 Units under the skin Every night  lisinopriL  (PRINIVIL ) 10 mg Oral Tablet, Take 1 Tablet (10 mg total) by mouth Daily  semaglutide  (OZEMPIC ) 0.25 mg or 0.5 mg (2 mg/3 mL) Subcutaneous Pen Injector, Inject 0.25 mg under the skin Every 7 days for 30 days, THEN 0.5 mg Every 7 days  for 180 days. (Patient not taking: No sig reported)  TRUE METRIX GLUCOSE TEST STRIP Does not apply Strip, USE AS DIRECTED TO CHECK BLOOD GLUCOSE LEVELS 3 TIMES DAILY    No facility-administered medications prior to visit.    Allergies:Allergies[2]    Physical Exam:  Vitals:    11/03/24 1051   BP: 130/80   Pulse: 80   Temp: 36.8 C (98.2 F)   SpO2: 98%   Weight: 103 kg (226 lb 3.2 oz)   Height: 1.854 m (6' 1)   BMI: 29.84      Physical Exam  Constitutional:       General: He is not in acute distress.     Appearance: Normal appearance.   HENT:      Right Ear: A middle ear effusion is present. Tympanic membrane is retracted. Tympanic membrane is not erythematous.      Left Ear: Tympanic membrane normal. Tympanic membrane is not erythematous.   Cardiovascular:      Rate and Rhythm: Normal rate and regular rhythm.      Heart sounds: No murmur heard.  Pulmonary:      Breath sounds: Normal breath sounds. No wheezing, rhonchi or rales.   Neurological:      Mental Status: He is oriented to person, place, and time.              Reviewed family history, medication history, social history, and allergies with the patient today.   Follow up: Return in about 6 months (around  05/03/2025).  Seek medical attention for new or worsening symptoms.  Medication list was confirmed with the patient and changes reflected in the medical record.  Discussed potential side effects as well as requirements of monitoring for any medications that were changed or added today.    Shawn Dasie Gal, MD          This note was partially created using MModal Fluency Direct system (voice recognition software) and is inherently subject to errors including those of syntax and sound-alike substitutions which may escape proofreading.  In such instances, original meaning may be extrapolated by contextual derivation.       [1]   Social History  Tobacco Use    Smoking status: Former     Current packs/day: 0.50     Average packs/day: 0.5 packs/day for 20.0 years (10.0 ttl pk-yrs)     Types: Cigarettes    Smokeless tobacco: Never   Vaping Use    Vaping status: Never Used   Substance Use Topics    Alcohol use: Not Currently     Comment: 1-2 times/year    Drug use: Never   [2] No Known Allergies

## 2024-11-13 ENCOUNTER — Other Ambulatory Visit: Payer: Self-pay

## 2024-12-04 ENCOUNTER — Ambulatory Visit: Payer: Self-pay | Admitting: NURSE PRACTITIONER

## 2025-02-15 ENCOUNTER — Ambulatory Visit (HOSPITAL_BASED_OUTPATIENT_CLINIC_OR_DEPARTMENT_OTHER): Admitting: INTERNAL MEDICINE-ENDOCRINOLOGY-DIABETES AND METABOLISM

## 2025-05-04 ENCOUNTER — Ambulatory Visit (HOSPITAL_BASED_OUTPATIENT_CLINIC_OR_DEPARTMENT_OTHER): Payer: Self-pay | Admitting: FAMILY PRACTICE
